# Patient Record
Sex: Male | Born: 1995
Health system: Southern US, Community
[De-identification: ages and names within clinical notes are randomized; demographics above are authoritative.]

---
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
## 2016-02-02 ENCOUNTER — Emergency Department (HOSPITAL_COMMUNITY)
Admission: EM | Admit: 2016-02-02 | Discharge: 2016-02-02 | Disposition: A | Discharge status: Home / Self Care | Payer: Managed Care, Other (non HMO) | Attending: Emergency Medicine | Admitting: Emergency Medicine

## 2016-02-02 ENCOUNTER — Emergency Department (HOSPITAL_COMMUNITY): Payer: Managed Care, Other (non HMO)

## 2016-02-02 ENCOUNTER — Encounter (HOSPITAL_COMMUNITY): Payer: Self-pay

## 2016-02-02 DIAGNOSIS — R51 Headache: Secondary | ICD-10-CM | POA: Insufficient documentation

## 2016-02-02 DIAGNOSIS — Y92410 Unspecified street and highway as the place of occurrence of the external cause: Secondary | ICD-10-CM | POA: Diagnosis not present

## 2016-02-02 DIAGNOSIS — S161XXA Strain of muscle, fascia and tendon at neck level, initial encounter: Secondary | ICD-10-CM | POA: Insufficient documentation

## 2016-02-02 DIAGNOSIS — Y939 Activity, unspecified: Secondary | ICD-10-CM | POA: Diagnosis not present

## 2016-02-02 DIAGNOSIS — F17200 Nicotine dependence, unspecified, uncomplicated: Secondary | ICD-10-CM | POA: Diagnosis not present

## 2016-02-02 DIAGNOSIS — S39012A Strain of muscle, fascia and tendon of lower back, initial encounter: Secondary | ICD-10-CM | POA: Diagnosis not present

## 2016-02-02 DIAGNOSIS — Y999 Unspecified external cause status: Secondary | ICD-10-CM | POA: Insufficient documentation

## 2016-02-02 DIAGNOSIS — S199XXA Unspecified injury of neck, initial encounter: Secondary | ICD-10-CM | POA: Diagnosis present

## 2016-02-02 MED ORDER — CYCLOBENZAPRINE HCL 10 MG PO TABS: 10.0000 mg | ORAL_TABLET | Freq: Two times a day (BID) | ORAL | 0 refills | Status: DC | PRN

## 2016-02-02 MED ORDER — IBUPROFEN 800 MG PO TABS: 800.0000 mg | ORAL_TABLET | Freq: Three times a day (TID) | ORAL | 0 refills | Status: DC
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
## 2016-02-02 NOTE — ED Notes (Signed)
Patient taken to xray.
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
## 2016-06-03 ENCOUNTER — Encounter: Payer: Self-pay | Admitting: Family Medicine

## 2016-06-03 ENCOUNTER — Ambulatory Visit (INDEPENDENT_AMBULATORY_CARE_PROVIDER_SITE_OTHER): Payer: Managed Care, Other (non HMO) | Admitting: Family Medicine

## 2016-06-03 VITALS — BP 92/60 | HR 73 | Temp 98.1°F | Ht 72.0 in | Wt 156.2 lb

## 2016-06-03 DIAGNOSIS — L659 Nonscarring hair loss, unspecified: Secondary | ICD-10-CM | POA: Insufficient documentation

## 2016-06-03 DIAGNOSIS — Z0000 Encounter for general adult medical examination without abnormal findings: Secondary | ICD-10-CM | POA: Diagnosis not present

## 2016-06-03 DIAGNOSIS — F17200 Nicotine dependence, unspecified, uncomplicated: Secondary | ICD-10-CM | POA: Diagnosis not present

## 2016-06-03 LAB — COMPLETE METABOLIC PANEL WITH GFR
ALT: 20 U/L (ref 9–46)
AST: 33 U/L (ref 10–40)
Albumin: 4.3 g/dL (ref 3.6–5.1)
Alkaline Phosphatase: 61 U/L (ref 40–115)
BUN: 13 mg/dL (ref 7–25)
CALCIUM: 9.2 mg/dL (ref 8.6–10.3)
CHLORIDE: 107 mmol/L (ref 98–110)
CO2: 26 mmol/L (ref 20–31)
CREATININE: 0.85 mg/dL (ref 0.60–1.35)
GFR, Est African American: >89 mL/min (ref >=60)
GFR, Est Non African American: >89 mL/min (ref >=60)
GLUCOSE: 91 mg/dL (ref 65–99)
POTASSIUM: 4 mmol/L (ref 3.5–5.3)
SODIUM: 139 mmol/L (ref 135–146)
Total Bilirubin: 0.6 mg/dL (ref 0.2–1.2)
Total Protein: 7.2 g/dL (ref 6.1–8.1)

## 2016-06-03 LAB — TSH: TSH: 0.6 mIU/L (ref 0.40–4.50)

## 2016-06-03 NOTE — Assessment & Plan Note (Addendum)
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  Clinical presentation is consistent.  There does not appear to be any evidence of fungal infection or trauma to these areas.  He has no anxiety/ OCD behaviors.  He does have a FM Hx of Graves disease but I would suspect more of a thinning process rather than frank, patchy hairloss.  TSH and BMP ordered for completion.  Referral to Dermatology placed.  Considered topical steroids but will await management/ recommendations from Derm.

## 2016-06-03 NOTE — Progress Notes (Signed)
Clinton Callahan is a 21 y.o. male presents to office today for annual physical exam examination.  Concerns today include:  1. Hair loss Patient reports that he noticed hair loss at the back of his head near the nape of his neck several weeks ago.  He denies itching, trauma.  He does not know what happened to the hair because it was not on his pillow.  No new pets, recent travel, rashes, hair loss elsewhere.  He notes that his father and paternal grandfather had Grave's Disease.  He denies palpitations, diarrhea, skin changes, excessive energy.  Last eye exam: >1 year, no h/o vision problems Last dental exam: 2 years ago  History reviewed. No pertinent past medical history. Social History   Social History  . Marital status: Single    Spouse name: N/A  . Number of children: N/A  . Years of education: N/A   Occupational History  . Not on file.   Social History Main Topics  . Smoking status: Current Every Day Smoker    Types: Cigars  . Smokeless tobacco: Never Used     Comment: black and mild  . Alcohol use No  . Drug use: No  . Sexual activity: Yes   Other Topics Concern  . Not on file   Social History Narrative   11th grade, lives with mom, grandma and sister   History reviewed. No pertinent surgical history. Family History  Problem Relation Age of Onset  . Heart murmur Mother   . Anxiety disorder Mother   . Graves' disease Father   . Diabetes Maternal Grandfather   . Heart disease Neg Hx   . Stroke Neg Hx     ROS: Review of Systems Constitutional: negative Eyes: negative Ears, nose, mouth, throat, and face: negative Respiratory: negative Cardiovascular: negative Gastrointestinal: negative Genitourinary:negative Integument/breast: positive for hair loss Hematologic/lymphatic: negative Musculoskeletal:positive for back pain Neurological: negative Behavioral/Psych: negative Endocrine: negative Allergic/Immunologic: negative    Physical exam BP 92/60    Pulse 73   Temp 98.1 F (36.7 C) (Oral)   Ht 6' (1.829 m)   Wt 156 lb 3.2 oz (70.9 kg)   SpO2 95%   BMI 21.18 kg/m  General appearance: alert, cooperative, appears stated age and no distress Head: Normocephalic, without obvious abnormality, atraumatic Eyes: negative findings: lids and lashes normal, conjunctivae and sclerae normal, corneas clear and pupils equal, round, reactive to light and accomodation Ears: normal TM's and external ear canals both ears Nose: Nares normal. Septum midline. Mucosa normal. No drainage or sinus tenderness. Throat: lips, mucosa, and tongue normal; teeth and gums normal Neck: no adenopathy, supple, symmetrical, trachea midline and thyroid with slight fullness but no palpable nodules/ abnormalities Back: symmetric, no curvature. ROM normal. No CVA tenderness. Lungs: clear to auscultation bilaterally Chest wall: no tenderness Heart: regular rate and rhythm, S1, S2 normal, no murmur, click, rub or gallop Abdomen: soft, non-tender; bowel sounds normal; no masses,  no organomegaly Extremities: extremities normal, atraumatic, no cyanosis or edema Pulses: 2+ and symmetric Skin: Skin color, texture, turgor normal. No rashes or lesions or 2.5 cm strip of hair missing from the base of the head. also a nickel size round area of missing hair along the base of the skull no the right. no skin breakdown/ irritation or abnormalities appreciated in these areas. Lymph nodes: Cervical, supraclavicular, and axillary nodes normal. Neurologic: Grossly normal    Assessment/ Plan: [name] here for annual physical exam.   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  Clinical presentation is consistent.  There does not appear to be any evidence of fungal infection or trauma to these areas.  He has no anxiety/ OCD behaviors.  He does have a FM Hx of Graves disease but I would suspect more of a thinning  process rather than frank, patchy hairloss.  Referral to Dermatology placed.  Considered topical steroids but will await management/ recommendations from Derm.  Tobacco use disorder Contemplative/ Action.  Recently had a child and has stopped smoking by default.  DOT form filled out and placed up front to be picked up.  [name], DO PGY-3, [facility]

## 2016-06-03 NOTE — Patient Instructions (Signed)
Alopecia Areata Alopecia areata is a type of hair loss. If you have this condition, you may lose hair on your scalp in patches. In some cases, you may lose all the hair on your scalp (alopecia totalis) or all the hair from your face and body (alopecia universalis).  Alopecia areata is an autoimmune disease. This means your body's defense system (immune system) mistakes normal parts of your body for germs or other things that can make you sick. When you have alopecia areata, your immune system attacks your hair follicles.  Alopecia areata often starts during childhood but can occur at any age. Alopecia areata is not a danger to your health but can be stressful.  CAUSES  The cause of alopecia areata is unknown.  RISK FACTORS You may be at higher risk of alopecia areata if you:   Have a family history of alopecia.  Have a family history of another autoimmune disease, including type 1 diabetes and rheumatoid arthritis. SIGNS AND SYMPTOMS Signs of alopecia areata may include:  Loss of scalp hair in small, round patches. These may be about the size of a quarter.  Loss of all hair on your scalp.  Loss of eyebrow hair, facial hair, or the hair inside your nose (nasal hair).  Hair loss over your entire body. DIAGNOSIS  Alopecia areata may be diagnosed by:  Medical history and physical exam.  Taking a sample of hair to check under a microscope.  Taking a small piece of skin (biopsy) to examine under a microscope.  Blood tests to rule out other autoimmune diseases. TREATMENT  There is no cure for alopecia areata, but the disease often goes away over time. You will not lose the ability to regrow hair. Some medicines may help your hair regrow more quickly. These include:  Corticosteroids. These block inflammation caused by your immune system. You may get this medicine as a lotion for your skin or as an injection.  Minoxidil. This is a hair growth medicine you can use in areas of hair  loss.  Anthralin. This is a medicine for a skin inflammation called psoriasis that may also help alopecia.  Diphencyprone. This medicine is applied to your skin and may stimulate hair growth. HOME CARE INSTRUCTIONS  Use sunscreen or cover your head when outdoors.  Take medicines only as directed by your health care provider.  If you have lost your eyebrows, wear sunglasses outside to keep dust out of your eyes.  If you have lost hair inside your nose, wear a kerchief over your face or apply ointment to the inside of your nose. This keeps out dust and other irritants.  Keep all follow-up visits as directed by your health care provider. This is important. SEEK MEDICAL CARE IF:  Your symptoms change.  You have new symptoms.  You have a reaction to your medicines.  You are struggling emotionally. This information is not intended to replace advice given to you by your health care provider. Make sure you discuss any questions you have with your health care provider. Document Released: 10/10/2003 Document Revised: 03/28/2014 Document Reviewed: 05/27/2013 Elsevier Interactive Patient Education  2017 [employer]  

## 2016-06-03 NOTE — Assessment & Plan Note (Signed)
Contemplative/ Action.  Recently had a child and has stopped smoking by default.

## 2016-06-08 ENCOUNTER — Telehealth: Payer: Self-pay

## 2016-06-08 ENCOUNTER — Encounter (HOSPITAL_COMMUNITY): Payer: Self-pay | Admitting: Emergency Medicine

## 2016-06-08 ENCOUNTER — Emergency Department (HOSPITAL_COMMUNITY): Payer: Managed Care, Other (non HMO)

## 2016-06-08 ENCOUNTER — Emergency Department (HOSPITAL_COMMUNITY)
Admission: EM | Admit: 2016-06-08 | Discharge: 2016-06-08 | Disposition: A | Discharge status: Home / Self Care | Payer: Managed Care, Other (non HMO) | Attending: Emergency Medicine | Admitting: Emergency Medicine

## 2016-06-08 DIAGNOSIS — F17210 Nicotine dependence, cigarettes, uncomplicated: Secondary | ICD-10-CM | POA: Insufficient documentation

## 2016-06-08 DIAGNOSIS — M25561 Pain in right knee: Secondary | ICD-10-CM | POA: Insufficient documentation

## 2016-06-08 DIAGNOSIS — Y999 Unspecified external cause status: Secondary | ICD-10-CM | POA: Insufficient documentation

## 2016-06-08 DIAGNOSIS — Y929 Unspecified place or not applicable: Secondary | ICD-10-CM | POA: Insufficient documentation

## 2016-06-08 DIAGNOSIS — Y9367 Activity, basketball: Secondary | ICD-10-CM | POA: Diagnosis not present

## 2016-06-08 DIAGNOSIS — W500XXA Accidental hit or strike by another person, initial encounter: Secondary | ICD-10-CM | POA: Diagnosis not present

## 2016-06-08 MED ORDER — IBUPROFEN 800 MG PO TABS: 800.0000 mg | ORAL_TABLET | Freq: Three times a day (TID) | ORAL | 0 refills | Status: DC | PRN

## 2016-06-08 NOTE — Discharge Instructions (Signed)
It was my pleasure taking care of you today!   Ibuprofen as needed for pain. Ice and elevate knee throughout the day.  Follow up with the orthopedist listed if symptoms do not improve in one week.   Return to the ER for new or worsening symptoms, any additional concerns.

## 2016-06-08 NOTE — Telephone Encounter (Signed)
-----   Message from Clinton IpAshly M Gottschalk, DO sent at 06/06/2016  2:01 PM EDT ----- No evidence of electrolyte imbalance or thyroid disorder.  Please call patient to inform and have him keep his dermatology appt.

## 2016-06-08 NOTE — ED Provider Notes (Signed)
[facility] Provider Note   CSN: [id number] Arrival date & time: 06/08/16  4098  By signing my name below, I, Cynda Acres, attest that this documentation has been prepared under the direction and in the presence of [facility], PA-C.Marland Kitchen Electronically Signed: Cynda Acres, Scribe. 06/08/16. 10:14 AM.  History   Chief Complaint Chief Complaint  Patient presents with  . Knee Injury   HPI Comments: Clinton Callahan is a 21 y.o. male with no pertinent medical history who presents to the Emergency Department complaining of sudden-onset, constant right knee pain that began yesterday. Patient states he was playing basketball yesterday, when his right knee hit another person's knee. Patient was unable to continue playing basketball. Patient reports associated pain with range of motion. Patient reports taking tylenol with no relief. Patient is ambulatory in the emergency department. Patient denies any numbness, weakness, nausea, vomiting, back pain, or neck pain.   The history is provided by the patient. No language interpreter was used.    History reviewed. No pertinent past medical history.  Patient Active Problem List   Diagnosis Date Noted  . Tobacco use disorder 06/03/2016  . Hair loss 06/03/2016  . Low back pain 11/16/2011    History reviewed. No pertinent surgical history.     Home Medications    Prior to Admission medications   Medication Sig Start Date End Date Taking? Authorizing Provider  ibuprofen (ADVIL,MOTRIN) 800 MG tablet Take 1 tablet (800 mg total) by mouth every 8 (eight) hours as needed. 06/08/16   Chase Picket Ward, PA-C    Family History Family History  Problem Relation Age of Onset  . Heart murmur Mother   . Anxiety disorder Mother   . Graves' disease Father   . Diabetes Maternal Grandfather   . Heart disease Neg Hx   . Stroke Neg Hx     Social History Social History  Substance Use Topics  . Smoking status: Current Every Day Smoker   Types: Cigars  . Smokeless tobacco: Never Used     Comment: black and mild  . Alcohol use No     Allergies   Patient has no known allergies.   Review of Systems Review of Systems  Gastrointestinal: Negative for nausea and vomiting.  Musculoskeletal: Positive for arthralgias (right knee). Negative for back pain, joint swelling and neck pain.  Neurological: Negative for weakness and numbness.     Physical Exam Updated Vital Signs BP 122/63 (BP Location: Right Arm)   Pulse (!) 52   Temp 98.4 F (36.9 C) (Oral)   Resp 16   Ht 6' (1.829 m)   Wt 68 kg   SpO2 99%   BMI 20.34 kg/m   Physical Exam  Constitutional: He appears well-developed and well-nourished. No distress.  HENT:  Head: Normocephalic and atraumatic.  Neck: Neck supple.  Cardiovascular: Normal rate, regular rhythm and normal heart sounds.   No murmur heard. Pulmonary/Chest: Effort normal and breath sounds normal. No respiratory distress. He has no wheezes. He has no rales.  Musculoskeletal: Normal range of motion. He exhibits tenderness. He exhibits no edema or deformity.  Right knee: No gross deformity noted. Knee with full ROM. + medial joint line tenderness. There is no joint effusion or swelling appreciated. No abnormal alignment or patellar mobility. No bruising, erythema, or warmth overlaying the joint. No varus/valgus laxity. Negative drawer's, negative Lachman's, no crepitus. 2+ DP pulses bilaterally. All compartments are soft. Sensation intact distal to injury.  Neurological: He is alert.  Skin: Skin  is warm and dry.  Psychiatric: He has a normal mood and affect.  Nursing note and vitals reviewed.    ED Treatments / Results  DIAGNOSTIC STUDIES: Oxygen Saturation is 99% on RA, normal by my interpretation.    COORDINATION OF CARE: 10:13 AM Discussed treatment plan with pt at bedside and pt agreed to plan, which includes ibuprofen.   Labs (all labs ordered are listed, but only abnormal results are  displayed) Labs Reviewed - No data to display  EKG  EKG Interpretation None       Radiology Dg Knee Complete 4 Views Right  Result Date: 06/08/2016 CLINICAL DATA:  Right knee pain after basketball injury yesterday. EXAM: RIGHT KNEE - COMPLETE 4+ VIEW COMPARISON:  None. FINDINGS: No evidence of fracture, dislocation, or joint effusion. No evidence of arthropathy or other focal bone abnormality. Soft tissues are unremarkable. IMPRESSION: Normal right knee. Electronically Signed   By: Lupita RaiderJames  Green Jr, M.D.   On: 06/08/2016 09:59    Procedures Procedures (including critical care time)  Medications Ordered in ED Medications - No data to display   Initial Impression / Assessment and Plan / ED Course  I have reviewed the triage vital signs and the nursing notes.  Pertinent labs & imaging results that were available during my care of the patient were reviewed by me and considered in my medical decision making (see chart for details).    Clinton Callahan is a 21 y.o. male who presents to ED for acute onset of right knee pain. On exam, ligaments appear intact and RLE is NVI. He does have tenderness to medial joint line but no crepitus appreciated and no pain with McMurray's. Patient has a knee sleeve at home which he would like to use. Offered crutches, but patient declines and is able to ambulate. Ortho follow up if symptoms persist. Return precautions discussed and all questions answered.    Final Clinical Impressions(s) / ED Diagnoses   Final diagnoses:  Acute pain of right knee    New Prescriptions Discharge Medication List as of 06/08/2016 10:16 AM     I personally performed the services described in this documentation, which was scribed in my presence. The recorded information has been reviewed and is accurate.    [facility] LLC[facility]ime Pilcher Ward, PA-C 06/08/16 1139    Benjiman CoreNathan Pickering, MD 06/08/16 [phone]

## 2016-06-08 NOTE — Telephone Encounter (Signed)
Pt informed. Sharon T Saunders, CMA  

## 2016-06-08 NOTE — ED Triage Notes (Signed)
Pt states he was playing bball yesterday and hit his right knee with another person's knee. Pt ambulatory. States he is in pain.

## 2016-08-14 ENCOUNTER — Emergency Department (HOSPITAL_COMMUNITY)
Admission: EM | Admit: 2016-08-14 | Discharge: 2016-08-14 | Disposition: A | Discharge status: Home / Self Care | Payer: Managed Care, Other (non HMO) | Attending: Emergency Medicine | Admitting: Emergency Medicine

## 2016-08-14 ENCOUNTER — Encounter (HOSPITAL_COMMUNITY): Payer: Self-pay

## 2016-08-14 DIAGNOSIS — F17290 Nicotine dependence, other tobacco product, uncomplicated: Secondary | ICD-10-CM | POA: Insufficient documentation

## 2016-08-14 DIAGNOSIS — L02412 Cutaneous abscess of left axilla: Secondary | ICD-10-CM | POA: Diagnosis not present

## 2016-08-14 DIAGNOSIS — R2232 Localized swelling, mass and lump, left upper limb: Secondary | ICD-10-CM | POA: Diagnosis present

## 2016-08-14 MED ORDER — LIDOCAINE HCL (PF) 1 % IJ SOLN
10.0000 mL | Freq: Once | INTRAMUSCULAR | Status: AC
  Administered 2016-08-14: 10 mL via INTRADERMAL
  Filled 2016-08-14: qty 10

## 2016-08-14 MED ORDER — CEPHALEXIN 500 MG PO CAPS: 500.0000 mg | ORAL_CAPSULE | Freq: Four times a day (QID) | ORAL | 0 refills | Status: DC

## 2016-08-14 MED ORDER — IBUPROFEN 400 MG PO TABS
600.0000 mg | ORAL_TABLET | Freq: Once | ORAL | Status: AC
  Administered 2016-08-14: 22:00:00 600 mg via ORAL
  Filled 2016-08-14: qty 1

## 2016-08-14 NOTE — ED Triage Notes (Signed)
Pt presents to the ed for a boil under his left arm that started 2 days ago

## 2016-08-14 NOTE — ED Provider Notes (Signed)
[facility] Provider Note   CSN: [id number] Arrival date & time: 08/14/16  2038  By signing my name below, I, Ny'Kea Lewis, attest that this documentation has been prepared under the direction and in the presence of Graciella FreerLindsey Emree Locicero, PA-C.  Electronically Signed: Karren CobbleNy'Kea Lewis, ED Scribe. 08/14/16. 10:27 PM.   History   Chief Complaint Chief Complaint  Patient presents with  . Recurrent Skin Infections   The history is provided by the patient. No language interpreter was used.   HPI Comments: Clinton Callahan is a 21 y.o. male with no pertinent PMHx, who presents to the Emergency Department complaining of a moderate, gradually worsening area of pain and swelling to the left armpit onset that started two days ago. Pt states pain is  exacerbated with direct pressure. Pt notes the presence of the area for a  but reports it has increased in size over the past two days. He has never experienced this issue before. He initally thought it was an ingrown hair so there were no treatment tried. Denies fever, chills, drainage from the area.   History reviewed. No pertinent past medical history.  Patient Active Problem List   Diagnosis Date Noted  . Tobacco use disorder 06/03/2016  . Hair loss 06/03/2016  . Low back pain 11/16/2011   History reviewed. No pertinent surgical history.  Home Medications    Prior to Admission medications   Medication Sig Start Date End Date Taking? Authorizing Provider  cephALEXin (KEFLEX) 500 MG capsule Take 1 capsule (500 mg total) by mouth 4 (four) times daily. 08/14/16   Maxwell CaulLayden, Shontez Sermon A, PA-C  ibuprofen (ADVIL,MOTRIN) 800 MG tablet Take 1 tablet (800 mg total) by mouth every 8 (eight) hours as needed. 06/08/16   Ward, Chase PicketJaime Pilcher, PA-C   Family History Family History  Problem Relation Age of Onset  . Heart murmur Mother   . Anxiety disorder Mother   . Graves' disease Father   . Diabetes Maternal Grandfather   . Heart disease Neg Hx   . Stroke Neg  Hx    Social History Social History  Substance Use Topics  . Smoking status: Current Every Day Smoker    Types: Cigars  . Smokeless tobacco: Never Used     Comment: black and mild  . Alcohol use No   Allergies   Patient has no known allergies.  Review of Systems Review of Systems  Constitutional: Negative for fever.  Gastrointestinal: Negative for vomiting.  Skin: Positive for wound (left axillia abcess ).  All other systems reviewed and are negative.  Physical Exam Updated Vital Signs BP 113/61   Pulse 61   Temp 98.2 F (36.8 C) (Oral)   Resp 18   SpO2 99%   Physical Exam  Constitutional: He appears well-developed and well-nourished.  Sitting comfortably on examination table  HENT:  Head: Normocephalic and atraumatic.  Eyes: Conjunctivae and EOM are normal. Right eye exhibits no discharge. Left eye exhibits no discharge. No scleral icterus.  Pulmonary/Chest: Effort normal.  Neurological: He is alert.  Skin: Skin is warm and dry. Capillary refill takes less than 2 seconds.  Left axilla with 2.5 cm area of erythema, swelling, and induration with central fluctuance. Purulent drainage is expressed with applied pressure.  Psychiatric: He has a normal mood and affect. His speech is normal and behavior is normal.  Nursing note and vitals reviewed.  ED Treatments / Results  DIAGNOSTIC STUDIES: Oxygen Saturation is 99% on RA, normal by my interpretation.   COORDINATION  OF CARE: 9:55 PM-Discussed next steps with pt. Pt verbalized understanding and is agreeable with the plan.   Labs (all labs ordered are listed, but only abnormal results are displayed) Labs Reviewed - No data to display  EKG  EKG Interpretation None       Radiology No results found.  Procedures .Marland KitchenIncision and Drainage Date/Time: 08/14/2016 11:15 PM Performed by: Graciella Freer A Authorized by: Graciella Freer A   Consent:    Consent obtained:  Verbal   Consent given by:  Patient    Risks discussed:  Incomplete drainage and infection Location:    Type:  Abscess   Location:  Upper extremity   Upper extremity location: Axilla. Pre-procedure details:    Skin preparation:  Betadine Anesthesia (see MAR for exact dosages):    Anesthesia method:  Local infiltration   Local anesthetic:  Lidocaine 1% w/o epi Procedure type:    Complexity:  Simple Procedure details:    Needle aspiration: yes     Needle size:  25 G   Incision types:  Stab incision   Incision depth:  Submucosal   Scalpel blade:  11   Wound management:  Probed and deloculated, irrigated with saline and extensive cleaning   Drainage:  Purulent   Drainage amount:  Scant   Wound treatment:  Wound left open Post-procedure details:    Patient tolerance of procedure:  Tolerated well, no immediate complications   (including critical care time)  Medications Ordered in ED Medications  lidocaine (PF) (XYLOCAINE) 1 % injection 10 mL (10 mLs Intradermal Given 08/14/16 2227)  ibuprofen (ADVIL,MOTRIN) tablet 600 mg (600 mg Oral Given 08/14/16 2227)    Initial Impression / Assessment and Plan / ED Course  I have reviewed the triage vital signs and the nursing notes.  Pertinent labs & imaging results that were available during my care of the patient were reviewed by me and considered in my medical decision making (see chart for details).     21 year old male who presents with 2 days of redness/swelling to left axilla. History/physical exam shows an area of erythema to the left axilla with some fluctuance to the central area. Some purulent drainage expressed with applied pressure. Consider abscess with questionable epidermoid cyst. Given active drainage will plan to I&D in the department.    I&D as documented above. Patient initially had some purulent drainage with applied pressure. On opening incision he had some scant purulent discharge.  Abscess was not large enough to warrant packing or drain,  wound recheck in 2  days. Encouraged home warm soaks and flushing.  Mild signs of cellulitis is surrounding skin. Will plan to cover for antibiotics given surrounding erythema.  Will d/c to home.  Strict return precautions discussed. Patient was understanding and agreement to plan.   Final Clinical Impressions(s) / ED Diagnoses   Final diagnoses:  Abscess of left axilla   New Prescriptions New Prescriptions   CEPHALEXIN (KEFLEX) 500 MG CAPSULE    Take 1 capsule (500 mg total) by mouth 4 (four) times daily.   I personally performed the services described in this documentation, which was scribed in my presence. The recorded information has been reviewed and is accurate.     Maxwell Caul, PA-C 08/15/16 0159    Alvira Monday, MD 08/17/16 [phone]

## 2016-08-14 NOTE — Discharge Instructions (Signed)
Take antibiotics as directed.  You can take Tylenol or Ibuprofen as directed for pain.  Apply warm compresses to the area to help continue to express drainage. Continue to press and massage the area to help with drainage.  Return the emergency Department in 2 days for wound recheck..  Return the emergency Department for any worsening pain, worsening swelling/redness, fever, redness that extends down her arm or any other worsening or concerning symptoms.  If you do not have a primary care doctor you see regularly, please you the list below. Please call them to arrange for follow-up.    No Primary Care Doctor Call [facility]  [phone] Other agencies that provide inexpensive medical care    [facility]  [phone]    [facility]  [phone]    [facility]  [phone]    [facility]  [phone]    [facility]  [phone]    [facility]  [phone]

## 2016-08-14 NOTE — ED Notes (Signed)
Patient transported to [name] 

## 2016-10-09 ENCOUNTER — Ambulatory Visit (HOSPITAL_COMMUNITY)
Admission: EM | Admit: 2016-10-09 | Discharge: 2016-10-09 | Disposition: A | Discharge status: Home / Self Care | Payer: Managed Care, Other (non HMO) | Attending: Physician Assistant | Admitting: Physician Assistant

## 2016-10-09 ENCOUNTER — Encounter (HOSPITAL_COMMUNITY): Payer: Self-pay | Admitting: Emergency Medicine

## 2016-10-09 DIAGNOSIS — H0289 Other specified disorders of eyelid: Secondary | ICD-10-CM | POA: Diagnosis not present

## 2016-10-09 MED ORDER — CEPHALEXIN 750 MG PO CAPS: 750.0000 mg | ORAL_CAPSULE | Freq: Two times a day (BID) | ORAL | 0 refills | Status: DC

## 2016-10-09 NOTE — ED Triage Notes (Signed)
Left upper eye lid swollen and red

## 2016-10-09 NOTE — Discharge Instructions (Signed)
Follow up as needed

## 2016-10-09 NOTE — ED Provider Notes (Signed)
10/09/2016 7:48 PM   DOB: [birth year] / MRN: [id number]  SUBJECTIVE:  Clinton SchleinCaleb J Loud is a 21 y.o. male presenting for left eye lid pain that started 2 days ago and is worsening.  Vision is normal.  Denies nausea, HA.  Feels that he is getting worse.   He has No Known Allergies.   He  has no past medical history on file.    He  reports that he has been smoking Cigars.  He has never used smokeless tobacco. He reports that he does not drink alcohol or use drugs. He  reports that he currently engages in sexual activity. The patient  has no past surgical history on file.  His family history includes Anxiety disorder in his mother; Diabetes in his maternal grandfather; Luiz BlareGraves' disease in his father; Heart murmur in his mother.  Review of Systems  Constitutional: Negative for fever.  Eyes: Negative for blurred vision, double vision and photophobia.  Gastrointestinal: Negative for abdominal pain and vomiting.  Neurological: Negative for dizziness.    OBJECTIVE:  BP (!) 111/57 (BP Location: Right Arm)   Pulse 60   Temp 98.1 F (36.7 C) (Oral)   Resp 16   SpO2 100%   Physical Exam  Constitutional: He appears well-developed. He is active and cooperative.  Non-toxic appearance.  Eyes: Left eye exhibits exudate. Left eye exhibits no chemosis, no discharge and no hordeolum. No foreign body present in the left eye. Left conjunctiva is not injected. Left conjunctiva has no hemorrhage. Left eye exhibits normal extraocular motion and no nystagmus. Left pupil is round and reactive.    Cardiovascular: Normal rate.   Pulmonary/Chest: Effort normal. No tachypnea.  Musculoskeletal: Normal range of motion.  Neurological: He is alert.  Skin: Skin is warm and dry. He is not diaphoretic. No pallor.  Vitals reviewed.   No results found for this or any previous visit (from the past 72 hour(s)).  No results found.  ASSESSMENT AND PLAN:  The encounter diagnosis was Eyelid pain, left.  Given his  tenderness on exam with cover for a preseptal cellulitis.  If that is this case then this is certainly very early.     The patient is advised to call or return to clinic if he does not see an improvement in symptoms, or to seek the care of the closest emergency department if he worsens with the above plan.   Deliah BostonMichael Romuald Mccaslin, MHS, PA-C 10/09/2016 7:48 PM    Ofilia Neaslark, Kailena Lubas L, PA-C 10/09/16 1949

## 2016-12-08 ENCOUNTER — Ambulatory Visit: Payer: Managed Care, Other (non HMO) | Admitting: Student

## 2017-01-03 ENCOUNTER — Encounter: Payer: Managed Care, Other (non HMO) | Admitting: Student

## 2017-02-16 ENCOUNTER — Telehealth: Payer: Self-pay | Admitting: *Deleted

## 2017-02-16 NOTE — Telephone Encounter (Signed)
Pt lm on nurse line asking about paperwork.  No evidence of paperwork and he has not been seen since 06/03/16, so would not have been dropped at that time.  Unable to reach by phone and no machine to leave message. Will await callback. Fleeger, Maryjo RochesterJessica Dawn, CMA

## 2017-02-27 ENCOUNTER — Ambulatory Visit: Payer: Managed Care, Other (non HMO) | Admitting: Student

## 2017-03-06 ENCOUNTER — Ambulatory Visit: Payer: Managed Care, Other (non HMO) | Admitting: Student

## 2017-03-08 ENCOUNTER — Other Ambulatory Visit: Payer: Self-pay

## 2017-03-08 ENCOUNTER — Ambulatory Visit (INDEPENDENT_AMBULATORY_CARE_PROVIDER_SITE_OTHER): Payer: Managed Care, Other (non HMO)

## 2017-03-08 ENCOUNTER — Encounter (HOSPITAL_COMMUNITY): Payer: Self-pay | Admitting: Emergency Medicine

## 2017-03-08 ENCOUNTER — Ambulatory Visit (HOSPITAL_COMMUNITY)
Admission: EM | Admit: 2017-03-08 | Discharge: 2017-03-08 | Disposition: A | Discharge status: Home / Self Care | Payer: Managed Care, Other (non HMO) | Attending: Family Medicine | Admitting: Family Medicine

## 2017-03-08 DIAGNOSIS — S90122A Contusion of left lesser toe(s) without damage to nail, initial encounter: Secondary | ICD-10-CM | POA: Diagnosis not present

## 2017-03-08 NOTE — Discharge Instructions (Signed)
Ice, elevation, ibuprofen as needed for pain control.

## 2017-03-08 NOTE — ED Triage Notes (Signed)
Pt states he had his left foot run over by a hand powered pallet fork lift yesterday when working at [employer].  Pt states he has pain in his three middle toes on his left foot.

## 2017-03-08 NOTE — ED Provider Notes (Signed)
[facility]    CSN: [id number] Arrival date & time: 03/08/17  1408     History   Chief Complaint Chief Complaint  Patient presents with  . Toe Injury    left 2nd, 3rd, & 4th    HPI Clinton SchleinCaleb J Callahan is a 21 y.o. male.   Clinton OliphantCaleb presents with complaints of pain to his second toe after it was ran over by a palette jack yesterday at work at [employer]. He had immediate pain. It is painful if it is hit, touches or if he walks wrong on it. Mild pain to third and fourth toes as well. Minimal pain while at rest, rates it 2/10 and throbbing currently. Has not taken any medications for his symptoms. Denies previous foot or toe injury. Denies numbness or tingling.    ROS per HPI.       History reviewed. No pertinent past medical history.  Patient Active Problem List   Diagnosis Date Noted  . Tobacco use disorder 06/03/2016  . Hair loss 06/03/2016  . Low back pain 11/16/2011    History reviewed. No pertinent surgical history.     Home Medications    Prior to Admission medications   Medication Sig Start Date End Date Taking? Authorizing Provider  ibuprofen (ADVIL,MOTRIN) 800 MG tablet Take 1 tablet (800 mg total) by mouth every 8 (eight) hours as needed. 06/08/16   Ward, Chase PicketJaime Pilcher, PA-C    Family History Family History  Problem Relation Age of Onset  . Heart murmur Mother   . Anxiety disorder Mother   . Graves' disease Father   . Diabetes Maternal Grandfather   . Heart disease Neg Hx   . Stroke Neg Hx     Social History Social History   Tobacco Use  . Smoking status: Never Smoker  . Smokeless tobacco: Never Used  . Tobacco comment: black and mild  Substance Use Topics  . Alcohol use: No    Alcohol/week: 0.0 oz  . Drug use: No     Allergies   Patient has no known allergies.   Review of Systems Review of Systems   Physical Exam Triage Vital Signs ED Triage Vitals  Enc Vitals Group     BP 03/08/17 1424 106/66     Pulse Rate 03/08/17  1424 65     Resp --      Temp 03/08/17 1424 98.1 F (36.7 C)     Temp Source 03/08/17 1424 Oral     SpO2 03/08/17 1424 96 %     Weight --      Height --      Head Circumference --      Peak Flow --      Pain Score 03/08/17 1423 3     Pain Loc --      Pain Edu? --      Excl. in GC? --    No data found.  Updated Vital Signs BP 106/66 (BP Location: Left Arm)   Pulse 65   Temp 98.1 F (36.7 C) (Oral)   SpO2 96%   Visual Acuity Right Eye Distance:   Left Eye Distance:   Bilateral Distance:    Right Eye Near:   Left Eye Near:    Bilateral Near:     Physical Exam  Constitutional: He is oriented to person, place, and time. He appears well-developed and well-nourished.  Cardiovascular: Normal rate and regular rhythm.  Pulmonary/Chest: Effort normal and breath sounds normal.  Musculoskeletal:  Left foot: There is tenderness, bony tenderness and swelling. There is normal range of motion, normal capillary refill, no crepitus, no deformity and no laceration.       Feet:  Mild swelling and tenderness to distal 2nd toe; full ROM to all toes; without tenderness to first, third, fourth or fifth toes; without break to skin  Neurological: He is alert and oriented to person, place, and time.  Skin: Skin is warm and dry.     UC Treatments / Results  Labs (all labs ordered are listed, but only abnormal results are displayed) Labs Reviewed - No data to display  EKG  EKG Interpretation None       Radiology Dg Foot Complete Left  Result Date: 03/08/2017 CLINICAL DATA:  Crush injury to the left foot with second through fourth toe pain. Initial encounter. EXAM: LEFT FOOT - COMPLETE 3+ VIEW COMPARISON:  None. FINDINGS: There is no evidence of fracture or dislocation. There is no evidence of arthropathy or other focal bone abnormality. Soft tissues are unremarkable. IMPRESSION: Negative. Electronically Signed   By: Marnee SpringJonathon  Watts M.D.   On: 03/08/2017 14:44     Procedures Procedures (including critical care time)  Medications Ordered in UC Medications - No data to display   Initial Impression / Assessment and Plan / UC Course  I have reviewed the triage vital signs and the nursing notes.  Pertinent labs & imaging results that were available during my care of the patient were reviewed by me and considered in my medical decision making (see chart for details).     Xray negative for acute injury. Pain consistent with contusion from being ran over. Ice, elevate, ibuprofen as needed. If symptoms worsen or do not improve in the next week to return to be seen or to follow up with PCP.  Patient verbalized understanding and agreeable to plan.  Ambulatory out of clinic without difficulty.    Final Clinical Impressions(s) / UC Diagnoses   Final diagnoses:  Contusion of lesser toe of left foot without damage to nail, initial encounter    ED Discharge Orders    None       Controlled Substance Prescriptions [place] Controlled Substance Registry consulted? Not Applicable   Georgetta HaberBurky, Jeury Mcnab B, NP 03/08/17 1454

## 2017-04-20 ENCOUNTER — Emergency Department (HOSPITAL_COMMUNITY): Payer: Managed Care, Other (non HMO)

## 2017-04-20 ENCOUNTER — Other Ambulatory Visit: Payer: Self-pay

## 2017-04-20 ENCOUNTER — Encounter (HOSPITAL_COMMUNITY): Payer: Self-pay | Admitting: Neurology

## 2017-04-20 ENCOUNTER — Emergency Department (HOSPITAL_COMMUNITY)
Admission: EM | Admit: 2017-04-20 | Discharge: 2017-04-20 | Disposition: A | Discharge status: Home / Self Care | Payer: Managed Care, Other (non HMO) | Attending: Emergency Medicine | Admitting: Emergency Medicine

## 2017-04-20 DIAGNOSIS — R509 Fever, unspecified: Secondary | ICD-10-CM | POA: Insufficient documentation

## 2017-04-20 DIAGNOSIS — F17210 Nicotine dependence, cigarettes, uncomplicated: Secondary | ICD-10-CM | POA: Diagnosis not present

## 2017-04-20 DIAGNOSIS — J101 Influenza due to other identified influenza virus with other respiratory manifestations: Secondary | ICD-10-CM

## 2017-04-20 DIAGNOSIS — M545 Low back pain, unspecified: Secondary | ICD-10-CM

## 2017-04-20 DIAGNOSIS — Z79899 Other long term (current) drug therapy: Secondary | ICD-10-CM | POA: Insufficient documentation

## 2017-04-20 LAB — CBC WITH DIFFERENTIAL/PLATELET
BASOS ABS: 0 10*3/uL (ref 0.0–0.1)
BASOS PCT: 0 %
Eosinophils Absolute: 0 10*3/uL (ref 0.0–0.7)
Eosinophils Relative: 0 %
HEMATOCRIT: 39 % (ref 39.0–52.0)
HEMOGLOBIN: 13.3 g/dL (ref 13.0–17.0)
Lymphocytes Relative: 18 %
Lymphs Abs: 0.9 10*3/uL (ref 0.7–4.0)
MCH: 28.1 pg (ref 26.0–34.0)
MCHC: 34.1 g/dL (ref 30.0–36.0)
MCV: 82.3 fL (ref 78.0–100.0)
Monocytes Absolute: 0.5 10*3/uL (ref 0.1–1.0)
Monocytes Relative: 10 %
NEUTROS ABS: 3.6 10*3/uL (ref 1.7–7.7)
NEUTROS PCT: 72 %
Platelets: 154 10*3/uL (ref 150–400)
RBC: 4.74 MIL/uL (ref 4.22–5.81)
RDW: 14.1 % (ref 11.5–15.5)
WBC: 5.1 10*3/uL (ref 4.0–10.5)

## 2017-04-20 LAB — COMPREHENSIVE METABOLIC PANEL
ALK PHOS: 60 U/L (ref 38–126)
ALT: 27 U/L (ref 17–63)
ANION GAP: 10 (ref 5–15)
AST: 29 U/L (ref 15–41)
Albumin: 4.2 g/dL (ref 3.5–5.0)
BILIRUBIN TOTAL: 0.4 mg/dL (ref 0.3–1.2)
BUN: 7 mg/dL (ref 6–20)
CALCIUM: 9.2 mg/dL (ref 8.9–10.3)
CO2: 23 mmol/L (ref 22–32)
Chloride: 103 mmol/L (ref 101–111)
Creatinine, Ser: 0.87 mg/dL (ref 0.61–1.24)
GFR calc non Af Amer: >60 mL/min (ref >60)
Glucose, Bld: 111 mg/dL — ABNORMAL HIGH (ref 65–99)
Potassium: 3 mmol/L — ABNORMAL LOW (ref 3.5–5.1)
SODIUM: 136 mmol/L (ref 135–145)
TOTAL PROTEIN: 7.6 g/dL (ref 6.5–8.1)

## 2017-04-20 LAB — INFLUENZA PANEL BY PCR (TYPE A & B)
INFLBPCR: NEGATIVE
Influenza A By PCR: POSITIVE — AB

## 2017-04-20 LAB — I-STAT CG4 LACTIC ACID, ED: Lactic Acid, Venous: 1.75 mmol/L (ref 0.5–1.9)

## 2017-04-20 MED ORDER — IBUPROFEN 800 MG PO TABS: 800.0000 mg | ORAL_TABLET | Freq: Three times a day (TID) | ORAL | 0 refills | Status: DC

## 2017-04-20 MED ORDER — KETOROLAC TROMETHAMINE 30 MG/ML IJ SOLN
30.0000 mg | Freq: Once | INTRAMUSCULAR | Status: AC
  Administered 2017-04-20: 30 mg via INTRAVENOUS
  Filled 2017-04-20: qty 1

## 2017-04-20 MED ORDER — SODIUM CHLORIDE 0.9 % IV BOLUS (SEPSIS)
1000.0000 mL | Freq: Once | INTRAVENOUS | Status: AC
  Administered 2017-04-20: 1000 mL via INTRAVENOUS

## 2017-04-20 MED ORDER — METHOCARBAMOL 500 MG PO TABS
750.0000 mg | ORAL_TABLET | Freq: Once | ORAL | Status: AC
Start: 2017-04-20 — End: 2017-04-20
  Administered 2017-04-20: 750 mg via ORAL
  Filled 2017-04-20: qty 2

## 2017-04-20 MED ORDER — OSELTAMIVIR PHOSPHATE 75 MG PO CAPS: 75.0000 mg | ORAL_CAPSULE | Freq: Two times a day (BID) | ORAL | 0 refills | Status: DC

## 2017-04-20 MED ORDER — ACETAMINOPHEN 325 MG PO TABS
650.0000 mg | ORAL_TABLET | Freq: Once | ORAL | Status: AC | PRN
  Administered 2017-04-20: 650 mg via ORAL
  Filled 2017-04-20: qty 2

## 2017-04-20 MED ORDER — CYCLOBENZAPRINE HCL 10 MG PO TABS: 10.0000 mg | ORAL_TABLET | Freq: Two times a day (BID) | ORAL | 0 refills | Status: DC | PRN

## 2017-04-20 MED ORDER — KETOROLAC TROMETHAMINE 30 MG/ML IJ SOLN: 30.0000 mg | Freq: Once | INTRAMUSCULAR | Status: DC

## 2017-04-20 NOTE — ED Provider Notes (Signed)
[facility] Provider Note   CSN: [id number] Arrival date & time: 04/20/17  1416     History   Chief Complaint Chief Complaint  Patient presents with  . Back Pain    HPI Clinton Callahan is a 22 y.o. male.  HPI Clinton Callahan is a 22 y.o. male with history of lower back pain, presents to emergency department with complaint of severe back pain.  Patient states that he has at baseline mild back discomfort from a prior weightlifting injury.  He states that he was feeling well yesterday, however this morning around 4 AM woke up in severe pain.  He reports pain is bilateral and midline over his lower back.  It does not radiate.  Pain is not reproducible with any palpation of the back but is worse with movement.  He states pain is more severe than it has ever been before.  He also reports waking up with a dry nonproductive cough.  He denies any nasal congestion.  No headache or neck pain or stiffness.  No chest pain or abdominal pain.  No urinary symptoms.  Denies any new injuries.  He took Tylenol this morning which helped.  Denies any nausea vomiting but states he has not had any appetite today.  Pt found to have fever of 102  History reviewed. No pertinent past medical history.  Patient Active Problem List   Diagnosis Date Noted  . Tobacco use disorder 06/03/2016  . Hair loss 06/03/2016  . Low back pain 11/16/2011    History reviewed. No pertinent surgical history.     Home Medications    Prior to Admission medications   Medication Sig Start Date End Date Taking? Authorizing Provider  ibuprofen (ADVIL,MOTRIN) 800 MG tablet Take 1 tablet (800 mg total) by mouth every 8 (eight) hours as needed. 06/08/16   Ward, Chase PicketJaime Pilcher, PA-C    Family History Family History  Problem Relation Age of Onset  . Heart murmur Mother   . Anxiety disorder Mother   . Graves' disease Father   . Diabetes Maternal Grandfather   . Heart disease Neg Hx   .  Stroke Neg Hx     Social History Social History   Tobacco Use  . Smoking status: Current Every Day Smoker  . Smokeless tobacco: Never Used  . Tobacco comment: black and mild  Substance Use Topics  . Alcohol use: No    Alcohol/week: 0.0 oz  . Drug use: No     Allergies   Patient has no known allergies.   Review of Systems Review of Systems  Constitutional: Negative for chills and fever.  Respiratory: Negative for cough, chest tightness and shortness of breath.   Cardiovascular: Negative for chest pain, palpitations and leg swelling.  Gastrointestinal: Negative for abdominal distention, abdominal pain, diarrhea, nausea and vomiting.  Genitourinary: Negative for dysuria, frequency, hematuria and urgency.  Musculoskeletal: Positive for arthralgias and back pain. Negative for myalgias, neck pain and neck stiffness.  Skin: Negative for rash.  Allergic/Immunologic: Negative for immunocompromised state.  Neurological: Negative for dizziness, weakness, light-headedness, numbness and headaches.  All other systems reviewed and are negative.    Physical Exam Updated Vital Signs BP (!) 130/58 (BP Location: Right Arm)   Pulse 89   Temp (!) 101.9 F (38.8 C) (Oral)   Resp 18   Ht 6' (1.829 m)   Wt 70.3 kg (155 lb)   SpO2 97%   BMI 21.02 kg/m   Physical Exam  Constitutional: He appears well-developed and well-nourished. No distress.  HENT:  Head: Normocephalic and atraumatic.  Right Ear: External ear normal.  Left Ear: External ear normal.  Nose: Nose normal.  Mouth/Throat: Oropharynx is clear and moist.  Neck: Normal range of motion. Neck supple.  No meningismus  Cardiovascular: Normal rate, regular rhythm and normal heart sounds.  Pulmonary/Chest: Effort normal and breath sounds normal. No respiratory distress. He has no wheezes. He has no rales.  Abdominal: Soft. He exhibits no distension and no mass. There is no tenderness. There is no guarding.  Musculoskeletal:    No tenderness to palpation over midline lumbar or thoracic spine or sacrum.  No tenderness over bilateral SI joints.  Patient does have pain in bilateral SI joint and sacrum with movement of bilateral hips.  No pain with straight leg raise bilaterally  Neurological:  5/5 and equal lower extremity strength. 2+ and equal patellar reflexes bilaterally. Pt able to dorsiflex bilateral toes and feet with good strength against resistance. Equal sensation bilaterally over thighs and lower legs.   Skin: Skin is warm and dry.  Nursing note and vitals reviewed.    ED Treatments / Results  Labs (all labs ordered are listed, but only abnormal results are displayed) Labs Reviewed  COMPREHENSIVE METABOLIC PANEL - Abnormal; Notable for the following components:      Result Value   Potassium 3.0 (*)    Glucose, Bld 111 (*)    All other components within normal limits  RESPIRATORY PANEL BY PCR  CBC WITH DIFFERENTIAL/PLATELET  URINALYSIS, ROUTINE W REFLEX MICROSCOPIC  I-STAT CG4 LACTIC ACID, ED  I-STAT CG4 LACTIC ACID, ED    EKG  EKG Interpretation None       Radiology Dg Chest 2 View  Result Date: 04/20/2017 CLINICAL DATA:  Chronic low back pain and intermittent cough. EXAM: CHEST  2 VIEW COMPARISON:  None. FINDINGS: The heart size and mediastinal contours are within normal limits. Both lungs are clear. The visualized skeletal structures are unremarkable. IMPRESSION: No active cardiopulmonary disease. Electronically Signed   By: Elberta Fortis M.D.   On: 04/20/2017 15:52    Procedures Procedures (including critical care time)  Medications Ordered in ED Medications  ketorolac (TORADOL) 30 MG/ML injection 30 mg (not administered)  methocarbamol (ROBAXIN) tablet 750 mg (not administered)  acetaminophen (TYLENOL) tablet 650 mg (650 mg Oral Given 04/20/17 1520)     Initial Impression / Assessment and Plan / ED Course  I have reviewed the triage vital signs and the nursing  notes.  Pertinent labs & imaging results that were available during my care of the patient were reviewed by me and considered in my medical decision making (see chart for details).  Clinical Course as of Apr 20 1841  Thu Apr 20, 2017  1802 WBC: 5.1 [MB]    Clinical Course User Index [MB] Anola Gurney    Patient in emergency department with sudden onset of worsening lower back pain early this morning, with no associated symptoms.  No lower extremity and weakness or numbness, no trouble controlling bowels or bladder, no concern for cauda equina.  He has no abdominal tenderness at all on my exam.  He is found to have a fever 102.  He does have mild cough.  Workup from triage showed normal white blood cell count, normal lactic acid, normal chest x-ray.  I added influenza panel.  If influenza panel negative, will get MRI of the lumbar spine and sacrum/pelvis to rule out infectious  source of his back pain.  Considered retrocecal appendicitis, however doubt with such sudden onset of sharp pain and no abdominal tenderness at all.  Patient received Tylenol upfront, temperature down to 101.9, will give Toradol, IV fluids, Robaxin for his back pain.  Patient signed out at shift change pending influenza panel.  If negative he will need to proceed with MRIs.  If positive, I suspect the patient most likely has worsening back pain from his influenza and okay to be discharged home.  I discussed this plan with patient and he agrees.  Vitals:   04/20/17 1511 04/20/17 1823 04/20/17 1825  BP: (!) 130/58    Pulse: (!) 105 89   Resp: 18  18  Temp: (!) 102 F (38.9 C)  (!) 101.9 F (38.8 C)  TempSrc: Oral  Oral  SpO2: 100% 97%   Weight: 70.3 kg (155 lb)    Height: 6' (1.829 m)       Final Clinical Impressions(s) / ED Diagnoses   Final diagnoses:  Acute midline low back pain without sciatica  Pyrexia of unknown origin following delivery    ED Discharge Orders    None        Jaynie Crumble, Cordelia Poche 04/20/17 2033    Phillis Haggis, MD 04/20/17 2055

## 2017-04-20 NOTE — ED Triage Notes (Addendum)
Pt reports chronic lower back pain, unknown cause. This morning at 0400 woke him up, and has been severe. Pain radiates around to right lower abdominal. Denies n/v/d. Denies hematuria. Denies any urinary sx. Fever 102.0. He does report cough intermittently. Denies any falls or injuries. Reports the back pain started years ago in weight training in high school.

## 2017-04-20 NOTE — ED Provider Notes (Signed)
Patient signed out to me at shift change.  Patient has had fever and low back pain.  Influenza panel is pending.  If influenza panel is negative, consider MRI and urinalysis for other etiologies.  Patient is influenza A positive.  Will treat with Tamiflu.  Will cancel MRIs per plan.  Discussed with Kirichenko and Dr. Phineas RealMabe, who agree with treatment and discharge plan.   Roxy HorsemanBrowning, Alp Goldwater, PA-C 04/20/17 2111    Phillis HaggisMabe, Martha L, MD 04/20/17 2118

## 2017-04-24 ENCOUNTER — Encounter: Payer: Self-pay | Admitting: Family Medicine

## 2017-04-24 ENCOUNTER — Other Ambulatory Visit: Payer: Self-pay

## 2017-04-24 ENCOUNTER — Ambulatory Visit (INDEPENDENT_AMBULATORY_CARE_PROVIDER_SITE_OTHER): Payer: Managed Care, Other (non HMO) | Admitting: Family Medicine

## 2017-04-24 VITALS — BP 100/70 | HR 77 | Temp 98.7°F | Wt 152.0 lb

## 2017-04-24 DIAGNOSIS — G8929 Other chronic pain: Secondary | ICD-10-CM

## 2017-04-24 DIAGNOSIS — M545 Low back pain: Secondary | ICD-10-CM

## 2017-04-24 DIAGNOSIS — F17200 Nicotine dependence, unspecified, uncomplicated: Secondary | ICD-10-CM

## 2017-04-24 DIAGNOSIS — L659 Nonscarring hair loss, unspecified: Secondary | ICD-10-CM | POA: Diagnosis not present
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
## 2017-04-24 NOTE — Assessment & Plan Note (Signed)
Still smoking 2-3 black/milds per day.   Declines cessation assistance

## 2017-04-24 NOTE — Assessment & Plan Note (Signed)
Renewing referal to derm, no new complaint but now has insurance

## 2017-04-24 NOTE — Assessment & Plan Note (Signed)
No bony abnormalities shown on multiple imaging in past years, still has chronic intermittent spasms.   Will refer to physical therapy and advise regular stretching/exercise with alternating ice/heat

## 2017-04-24 NOTE — Patient Instructions (Signed)
It was a pleasure to see you today! Thank you for choosing [facility] for your primary care. Clinton SchleinCaleb J Callahan was seen for low back pain. Come back to the clinic if you have any worsening symptoms, and go to the emergency room if you develop any numbness in your groin, muscle weakness or lose sensation.  We are sending you to physical therapy and asking that you make stretching/exercise part of your daily routine along with alternating cold/heat.    We can also resend your referal to derm for he balding spots of hair you have.  If we did any lab work today, and the results require attention, either me or my nurse will get in touch with you. If everything is normal, you will get a letter in mail and a message via . If you don't hear from us in two weeks, please give us a call. Otherwise, we look forward to seeing you again at your next visit. If you have any questions or concerns before then, please call the clinic at [phone].  Please bring all your medications to every doctors visit  Sign up for My Chart to have easy access to your labs results, and communication with your Primary care physician.    Please check-out at the front desk before leaving the clinic.    Best,  Dr. Marthenia RollingScott Keundra Petrucelli FAMILY MEDICINE RESIDENT - PGY1 04/24/2017 11:47 AM

## 2017-04-27 ENCOUNTER — Ambulatory Visit: Payer: Managed Care, Other (non HMO) | Attending: Family Medicine

## 2017-05-23 ENCOUNTER — Ambulatory Visit: Payer: Managed Care, Other (non HMO) | Attending: Family Medicine

## 2017-06-12 ENCOUNTER — Encounter (HOSPITAL_COMMUNITY): Payer: Self-pay | Admitting: Emergency Medicine

## 2017-06-12 ENCOUNTER — Ambulatory Visit (HOSPITAL_COMMUNITY)
Admission: EM | Admit: 2017-06-12 | Discharge: 2017-06-12 | Disposition: A | Discharge status: Home / Self Care | Payer: Managed Care, Other (non HMO) | Attending: Family Medicine | Admitting: Family Medicine

## 2017-06-12 ENCOUNTER — Ambulatory Visit (INDEPENDENT_AMBULATORY_CARE_PROVIDER_SITE_OTHER): Payer: Managed Care, Other (non HMO)

## 2017-06-12 DIAGNOSIS — Y9367 Activity, basketball: Secondary | ICD-10-CM | POA: Diagnosis not present

## 2017-06-12 DIAGNOSIS — S93401A Sprain of unspecified ligament of right ankle, initial encounter: Secondary | ICD-10-CM

## 2017-06-12 MED ORDER — DICLOFENAC SODIUM 75 MG PO TBEC: 75.0000 mg | DELAYED_RELEASE_TABLET | Freq: Two times a day (BID) | ORAL | 0 refills | Status: DC

## 2017-06-12 NOTE — ED Provider Notes (Signed)
[facility]   [id number] 06/12/17 Arrival Time: 1027  ASSESSMENT & PLAN:  1. Sprain of right ankle, unspecified ligament, initial encounter    Imaging: Dg Ankle Complete Right  Result Date: 06/12/2017 CLINICAL DATA:  Injured ankle playing basketball yesterday. EXAM: RIGHT ANKLE - COMPLETE 3+ VIEW COMPARISON:  Radiographs 02/26/2017 FINDINGS: The ankle mortise is maintained. No acute ankle fracture or osteochondral lesion. No definite joint effusion. The mid and hindfoot bony structures are intact. IMPRESSION: No acute fracture. Electronically Signed   By: Rudie MeyerP.  Gallerani M.D.   On: 06/12/2017 12:18   Dg Foot Complete Right  Result Date: 06/12/2017 CLINICAL DATA:  Basketball injury yesterday with lateral foot pain, initial encounter. EXAM: RIGHT FOOT COMPLETE - 3+ VIEW COMPARISON:  None. FINDINGS: No acute osseous or joint abnormality. IMPRESSION: No acute osseous or joint abnormality. Electronically Signed   By: Leanna BattlesMelinda  Blietz M.D.   On: 06/12/2017 11:46    Meds ordered this encounter  Medications  . diclofenac (VOLTAREN) 75 MG EC tablet    Sig: Take 1 tablet (75 mg total) by mouth 2 (two) times daily.    Dispense:  14 tablet    Refill:  0   ASO splint applied. Written information on treating ankle sprains given. Work note given. Will follow up with PCP or here if worsening or failing to improve as anticipated.  Reviewed expectations re: course of current medical issues. Questions answered. Outlined signs and symptoms indicating need for more acute intervention. Patient verbalized understanding. After Visit Summary given.  SUBJECTIVE: History from: patient. Clinton Callahan is a 22 y.o. male who reports fairly persistent and poorly localized pain of his right ankle that is stable; described as aching and dull without radiation Onset: gradual, 1-2 days ago. Injury/trama: yes, reports twisting ankle while playing basketball; able to bear weight with discomfort initially  and since. Relieved by: elevating ankle and putting ice on it. Worsened by: certain movements and weight bearing. Associated symptoms: none reported. Extremity sensation changes or weakness: none. Self treatment: has not tried OTCs for relief of pain.  ROS: As per HPI.   OBJECTIVE:  Vitals:   06/12/17 1134  BP: (!) 106/49  Pulse: (!) 57  Resp: 18  Temp: 98.2 F (36.8 C)  TempSrc: Oral  SpO2: 100%    General appearance: alert; no distress Extremities: no cyanosis or edema; symmetrical with no gross deformities; poorly localized tenderness over medial malleolus that extends toward foot; minimal swelling over medial malleolus; no specific 5th metatarsal tenderness; no bruising; FROM with medial discomfort CV: normal extremity capillary refill Skin: warm and dry Neurologic: normal gait; normal symmetric reflexes in all extremities; normal sensation in all extremities Psychological: alert and cooperative; normal mood and affect  No Known Allergies   Social History   Socioeconomic History  . Marital status: Single    Spouse name: Not on file  . Number of children: Not on file  . Years of education: Not on file  . Highest education level: Not on file  Occupational History  . Not on file  Social Needs  . Financial resource strain: Not on file  . Food insecurity:    Worry: Not on file    Inability: Not on file  . Transportation needs:    Medical: Not on file    Non-medical: Not on file  Tobacco Use  . Smoking status: Current Every Day Smoker  . Smokeless tobacco: Never Used  . Tobacco comment: black and mild  Substance and Sexual  Activity  . Alcohol use: No    Alcohol/week: 0.0 oz  . Drug use: No  . Sexual activity: Yes  Lifestyle  . Physical activity:    Days per week: Not on file    Minutes per session: Not on file  . Stress: Not on file  Relationships  . Social connections:    Talks on phone: Not on file    Gets together: Not on file    Attends religious  service: Not on file    Active member of club or organization: Not on file    Attends meetings of clubs or organizations: Not on file    Relationship status: Not on file  . Intimate partner violence:    Fear of current or ex partner: Not on file    Emotionally abused: Not on file    Physically abused: Not on file    Forced sexual activity: Not on file  Other Topics Concern  . Not on file  Social History Narrative   11th grade, lives with mom, grandma and sister   Family History  Problem Relation Age of Onset  . Heart murmur Mother   . Anxiety disorder Mother   . Graves' disease Father   . Diabetes Maternal Grandfather   . Heart disease Neg Hx   . Stroke Neg Hx    History reviewed. No pertinent surgical history.    Mardella Layman, MD 06/12/17 1258

## 2017-06-12 NOTE — ED Triage Notes (Signed)
Pt sts right foot pain x 2 days after twisting while playing basketball

## 2017-10-03 ENCOUNTER — Encounter: Payer: Managed Care, Other (non HMO) | Admitting: Student in an Organized Health Care Education/Training Program

## 2017-10-03 NOTE — Progress Notes (Deleted)
   Subjective:    [name] J [place] - 22 y.o. male MRN [id number]  Date of birth: [birth year]  HPI  [name] J Veldman is here for ***.  -  reports that he has been smoking.  He has never used smokeless tobacco. - Review of Systems: Per HPI. - Past Medical History: Patient Active Problem List   Diagnosis Date Noted  . Tobacco use disorder 06/03/2016  . Hair loss 06/03/2016  . Low back pain 11/16/2011   - Medications: reviewed and updated Current Outpatient Medications  Medication Sig Dispense Refill  . cyclobenzaprine (FLEXERIL) 10 MG tablet Take 1 tablet (10 mg total) by mouth 2 (two) times daily as needed for muscle spasms. 20 tablet 0  . diclofenac (VOLTAREN) 75 MG EC tablet Take 1 tablet (75 mg total) by mouth 2 (two) times daily. 14 tablet 0  . oseltamivir (TAMIFLU) 75 MG capsule Take 1 capsule (75 mg total) by mouth every 12 (twelve) hours. 10 capsule 0   No current facility-administered medications for this visit.     Review of Systems See HPI     Objective:   Physical Exam There were no vitals taken for this visit. Gen: NAD, alert, cooperative with exam, well-appearing *** HEENT: NCAT, PERRL, clear conjunctiva, oropharynx clear, supple neck CV: RRR, good S1/S2, no murmur, no edema, capillary refill brisk  Resp: CTABL, no wheezes, non-labored Abd: SNTND, BS present, no guarding or organomegaly Skin: no rashes, normal turgor  Neuro: no gross deficits.  Psych: good insight, alert and oriented     Assessment & Plan:   No problem-specific Assessment & Plan notes found for this encounter.   No orders of the defined types were placed in this encounter.   No orders of the defined types were placed in this encounter.   Howard PouchLauren Zyliah Schier, MD,MS,  PGY2 10/03/2017 4:49 AM

## 2017-10-14 ENCOUNTER — Encounter (HOSPITAL_COMMUNITY): Payer: Self-pay | Admitting: Emergency Medicine

## 2017-10-14 ENCOUNTER — Emergency Department (HOSPITAL_COMMUNITY)
Admission: EM | Admit: 2017-10-14 | Discharge: 2017-10-14 | Disposition: A | Discharge status: Home / Self Care | Payer: Managed Care, Other (non HMO) | Attending: Emergency Medicine | Admitting: Emergency Medicine

## 2017-10-14 DIAGNOSIS — L02211 Cutaneous abscess of abdominal wall: Secondary | ICD-10-CM | POA: Diagnosis present

## 2017-10-14 DIAGNOSIS — F17200 Nicotine dependence, unspecified, uncomplicated: Secondary | ICD-10-CM | POA: Diagnosis not present

## 2017-10-14 DIAGNOSIS — L02219 Cutaneous abscess of trunk, unspecified: Secondary | ICD-10-CM

## 2017-10-14 MED ORDER — SULFAMETHOXAZOLE-TRIMETHOPRIM 800-160 MG PO TABS: 1.0000 | ORAL_TABLET | Freq: Two times a day (BID) | ORAL | 0 refills | Status: AC

## 2017-10-14 MED ORDER — LIDOCAINE-EPINEPHRINE 1 %-1:100000 IJ SOLN
10.0000 mL | Freq: Once | INTRAMUSCULAR | Status: AC
  Administered 2017-10-14: 10 mL
  Filled 2017-10-14: qty 20

## 2017-10-14 MED ORDER — NAPROXEN 250 MG PO TABS
500.0000 mg | ORAL_TABLET | Freq: Once | ORAL | Status: AC
  Administered 2017-10-14: 500 mg via ORAL
  Filled 2017-10-14: qty 2

## 2017-10-14 MED ORDER — NAPROXEN 500 MG PO TABS: 500.0000 mg | ORAL_TABLET | Freq: Two times a day (BID) | ORAL | 0 refills | Status: DC

## 2017-10-14 NOTE — Discharge Instructions (Signed)
Take bactrim as prescribed until finished. Apply a warm compress to the area 3-4 times per day to promote drainage.

## 2017-10-14 NOTE — ED Triage Notes (Signed)
Pt reports abscess to lower abd area. Pt states he thinks he has an ingrown hair.

## 2017-10-14 NOTE — ED Provider Notes (Signed)
[facility] EMERGENCY DEPARTMENT Provider Note   CSN: [id number] Arrival date & time: 10/14/17  2130     History   Chief Complaint Chief Complaint  Patient presents with  . Abscess    HPI Clinton Callahan is a 22 y.o. male.  The history is provided by the patient. No language interpreter was used.  Abscess  Location:  Torso Torso abscess location: suprapubic abdominal wall. Size:  1x2.5cm Abscess quality: fluctuance and painful   Red streaking: no   Duration:  2 days Progression:  Worsening Pain details:    Quality:  Aching   Severity:  Moderate   Duration:  2 days   Timing:  Constant   Progression:  Worsening Chronicity:  New Context: not immunosuppression and not skin injury   Relieved by: topical tea tree oil. Associated symptoms: no fever   Risk factors: prior abscess     History reviewed. No pertinent past medical history.  Patient Active Problem List   Diagnosis Date Noted  . Tobacco use disorder 06/03/2016  . Hair loss 06/03/2016  . Low back pain 11/16/2011    History reviewed. No pertinent surgical history.      Home Medications    Prior to Admission medications   Medication Sig Start Date End Date Taking? Authorizing Provider  cyclobenzaprine (FLEXERIL) 10 MG tablet Take 1 tablet (10 mg total) by mouth 2 (two) times daily as needed for muscle spasms. 04/20/17   Roxy Horseman, PA-C  diclofenac (VOLTAREN) 75 MG EC tablet Take 1 tablet (75 mg total) by mouth 2 (two) times daily. 06/12/17   Mardella Layman, MD  naproxen (NAPROSYN) 500 MG tablet Take 1 tablet (500 mg total) by mouth 2 (two) times daily. 10/14/17   Antony Madura, PA-C  sulfamethoxazole-trimethoprim (BACTRIM DS,SEPTRA DS) 800-160 MG tablet Take 1 tablet by mouth 2 (two) times daily for 7 days. 10/14/17 10/21/17  Antony Madura, PA-C    Family History Family History  Problem Relation Age of Onset  . Heart murmur Mother   . Anxiety disorder Mother   . Graves' disease  Father   . Diabetes Maternal Grandfather   . Heart disease Neg Hx   . Stroke Neg Hx     Social History Social History   Tobacco Use  . Smoking status: Current Every Day Smoker  . Smokeless tobacco: Never Used  . Tobacco comment: black and mild  Substance Use Topics  . Alcohol use: No    Alcohol/week: 0.0 oz  . Drug use: No     Allergies   Patient has no known allergies.   Review of Systems Review of Systems  Constitutional: Negative for fever.  Ten systems reviewed and are negative for acute change, except as noted in the HPI.    Physical Exam Updated Vital Signs BP (!) 116/51   Pulse 71   Temp 97.7 F (36.5 C) (Oral)   Resp 16   Ht 5\' 8"  (1.727 m)   Wt 70.3 kg (155 lb)   SpO2 100%   BMI 23.57 kg/m   Physical Exam  Constitutional: He is oriented to person, place, and time. He appears well-developed and well-nourished. No distress.  Nontoxic appearing and in NAD  HENT:  Head: Normocephalic and atraumatic.  Eyes: Conjunctivae and EOM are normal. No scleral icterus.  Neck: Normal range of motion.  Cardiovascular: Normal rate, regular rhythm and intact distal pulses.  Pulmonary/Chest: Effort normal. No stridor. No respiratory distress.  Respirations even and unlabored  Genitourinary:  Musculoskeletal: Normal range of motion.  Neurological: He is alert and oriented to person, place, and time. He exhibits normal muscle tone. Coordination normal.  Ambulatory with steady gait.  Skin: Skin is warm and dry. No rash noted. He is not diaphoretic. No erythema. No pallor.  Psychiatric: He has a normal mood and affect. His behavior is normal.  Nursing note and vitals reviewed.    ED Treatments / Results  Labs (all labs ordered are listed, but only abnormal results are displayed) Labs Reviewed - No data to display  EKG None  Radiology No results found.  Procedures Procedures (including critical care time)  INCISION AND DRAINAGE Performed by: Antony MaduraKelly  Rorey Hodges Consent: Verbal consent obtained. Risks and benefits: risks, benefits and alternatives were discussed Type: abscess  Body area: suprapubic  Anesthesia: local infiltration  Incision was made with a scalpel.  Local anesthetic: lidocaine 1% with epinephrine  Anesthetic total: 3 ml  Complexity: complex Blunt dissection to break up loculations  Drainage: purulent  Drainage amount: small  Packing material: none  Patient tolerance: Patient tolerated the procedure well with no immediate complications.    Medications Ordered in ED Medications  lidocaine-EPINEPHrine (XYLOCAINE W/EPI) 1 %-1:100000 (with pres) injection 10 mL (has no administration in time range)  naproxen (NAPROSYN) tablet 500 mg (500 mg Oral Given 10/14/17 0519)     Initial Impression / Assessment and Plan / ED Course  I have reviewed the triage vital signs and the nursing notes.  Pertinent labs & imaging results that were available during my care of the patient were reviewed by me and considered in my medical decision making (see chart for details).     Patient with skin abscess amenable to incision and drainage.  Abscess was not large enough to warrant packing or drain,  wound recheck in 2 days. Encouraged home warm soaks and flushing.  Mild signs of cellulitis is surrounding skin.  Will d/c to home on Bactrim.  Return precautions discussed and provided. Patient discharged in stable condition with no unaddressed concerns.   Final Clinical Impressions(s) / ED Diagnoses   Final diagnoses:  Abscess of suprapubic region    ED Discharge Orders        Ordered    sulfamethoxazole-trimethoprim (BACTRIM DS,SEPTRA DS) 800-160 MG tablet  2 times daily     10/14/17 0606    naproxen (NAPROSYN) 500 MG tablet  2 times daily     10/14/17 0606       Antony MaduraHumes, Gerard Cantara, PA-C 10/14/17 [id number]    Dione BoozeGlick, David, MD 10/14/17 [phone]

## 2018-02-02 ENCOUNTER — Ambulatory Visit (HOSPITAL_COMMUNITY)
Admission: EM | Admit: 2018-02-02 | Discharge: 2018-02-02 | Disposition: A | Discharge status: Home / Self Care | Payer: Managed Care, Other (non HMO) | Attending: Family Medicine | Admitting: Family Medicine

## 2018-02-02 ENCOUNTER — Ambulatory Visit (INDEPENDENT_AMBULATORY_CARE_PROVIDER_SITE_OTHER): Payer: Managed Care, Other (non HMO)

## 2018-02-02 ENCOUNTER — Encounter (HOSPITAL_COMMUNITY): Payer: Self-pay

## 2018-02-02 DIAGNOSIS — S60221A Contusion of right hand, initial encounter: Secondary | ICD-10-CM

## 2018-02-02 DIAGNOSIS — W19XXXA Unspecified fall, initial encounter: Secondary | ICD-10-CM | POA: Diagnosis not present

## 2018-02-02 DIAGNOSIS — R2231 Localized swelling, mass and lump, right upper limb: Secondary | ICD-10-CM | POA: Diagnosis not present

## 2018-02-02 DIAGNOSIS — Y9367 Activity, basketball: Secondary | ICD-10-CM | POA: Diagnosis not present

## 2018-02-02 MED ORDER — IBUPROFEN 800 MG PO TABS: 800.0000 mg | ORAL_TABLET | Freq: Three times a day (TID) | ORAL | 0 refills | Status: DC

## 2018-02-02 NOTE — ED Provider Notes (Signed)
[facility]    CSN: [id number] Arrival date & time: 02/02/18  4098     History   Chief Complaint Chief Complaint  Patient presents with  . Hand Pain    HPI Clinton Callahan is a 22 y.o. male.   HPI  Patient states he was playing basketball this morning.  He jumped up for a shot, and another player "cut my legs out from under me".  He fell forward and landed on his right palm.  He is having right hand pain.  His hand is swollen.  He has pain with finger movement.  Pain with wrist movement.  Normal sensation.  No prior fracture.    History reviewed. No pertinent past medical history.  Patient Active Problem List   Diagnosis Date Noted  . Tobacco use disorder 06/03/2016  . Hair loss 06/03/2016  . Low back pain 11/16/2011    History reviewed. No pertinent surgical history.     Home Medications    Prior to Admission medications   Medication Sig Start Date End Date Taking? Authorizing Provider  ibuprofen (ADVIL,MOTRIN) 800 MG tablet Take 1 tablet (800 mg total) by mouth 3 (three) times daily. 02/02/18   Eustace Moore, MD    Family History Family History  Problem Relation Age of Onset  . Heart murmur Mother   . Anxiety disorder Mother   . Graves' disease Father   . Diabetes Maternal Grandfather   . Heart disease Neg Hx   . Stroke Neg Hx     Social History Social History   Tobacco Use  . Smoking status: Current Every Day Smoker  . Smokeless tobacco: Never Used  . Tobacco comment: black and mild  Substance Use Topics  . Alcohol use: No    Alcohol/week: 0.0 standard drinks  . Drug use: No     Allergies   Patient has no known allergies.   Review of Systems Review of Systems  Constitutional: Negative for chills and fever.  HENT: Negative for ear pain and sore throat.   Eyes: Negative for pain and visual disturbance.  Respiratory: Negative for cough and shortness of breath.   Cardiovascular: Negative for chest pain and palpitations.    Gastrointestinal: Negative for abdominal pain and vomiting.  Genitourinary: Negative for dysuria and hematuria.  Musculoskeletal: Negative for arthralgias and back pain.       Right hand pain  Skin: Negative for color change and rash.  Neurological: Negative for seizures and syncope.  All other systems reviewed and are negative.    Physical Exam Triage Vital Signs ED Triage Vitals  Enc Vitals Group     BP 02/02/18 0907 121/66     Pulse Rate 02/02/18 0907 (!) 58     Resp 02/02/18 0907 16     Temp 02/02/18 0907 98.4 F (36.9 C)     Temp Source 02/02/18 0907 Oral     SpO2 02/02/18 0907 100 %     Weight --      Height --      Head Circumference --      Peak Flow --      Pain Score 02/02/18 0915 8     Pain Loc --      Pain Edu? --      Excl. in GC? --    No data found.  Updated Vital Signs BP 121/66 (BP Location: Right Arm)   Pulse (!) 58   Temp 98.4 F (36.9 C) (Oral)   Resp 16  SpO2 100%      Physical Exam  Constitutional: He appears well-developed and well-nourished. No distress.  HENT:  Head: Normocephalic and atraumatic.  Mouth/Throat: Oropharynx is clear and moist.  Eyes: Pupils are equal, round, and reactive to light. Conjunctivae are normal.  Neck: Normal range of motion.  Cardiovascular: Normal rate.  Pulmonary/Chest: Effort normal. No respiratory distress.  Abdominal: Soft. He exhibits no distension.  Musculoskeletal: Normal range of motion. He exhibits no edema.  Slight swelling in the right thenar eminence.  No bruising noted.  Tenderness to palpation over the hamate.  Mild tenderness over the scaphoid.  Slow but full range of motion of the wrist and hand.  Normal sensory exam to the fingers.  Neurological: He is alert.  Skin: Skin is warm and dry.  Psychiatric: He has a normal mood and affect. His behavior is normal.     UC Treatments / Results  Labs (all labs ordered are listed, but only abnormal results are displayed) Labs Reviewed - No data  to display  EKG None  Radiology Dg Hand Complete Right  Result Date: 02/02/2018 CLINICAL DATA:  Larey SeatFell playing basketball this morning with right hand injury. Palm are pain. EXAM: RIGHT HAND - COMPLETE 3+ VIEW COMPARISON:  None. FINDINGS: The bones are subjectively adequately mineralized. There is no acute fracture nor dislocation. Specific attention to the metacarpals reveals no acute bony abnormality. The soft tissues are unremarkable. IMPRESSION: There is no acute or significant chronic bony abnormality of the right hand. Electronically Signed   By: David  SwazilandJordan M.D.   On: 02/02/2018 09:54    Procedures Procedures (including critical care time)  Medications Ordered in UC Medications - No data to display  Initial Impression / Assessment and Plan / UC Course  I have reviewed the triage vital signs and the nursing notes.  Pertinent labs & imaging results that were available during my care of the patient were reviewed by me and considered in my medical decision making (see chart for details).    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  He does not feel like he can push his hand.  I agree and I am giving him 2 days off of work.  Discussed conservative treatment.  Reasons for return. Final Clinical Impressions(s) / UC Diagnoses   Final diagnoses:  Contusion of right hand, initial encounter     Discharge Instructions     Use ice for 20 minutes every couple of hours today Elevate hand to reduce pain and swelling Take ibuprofen 3 times a day with food Limit use of hand while painful Off work until Monday   ED Prescriptions    Medication Sig Dispense Auth. Provider   ibuprofen (ADVIL,MOTRIN) 800 MG tablet Take 1 tablet (800 mg total) by mouth 3 (three) times daily. 21 tablet Eustace MooreNelson,  Sue, MD     Controlled Substance Prescriptions [place] Controlled Substance Registry consulted? Not Applicable   Eustace MooreNelson,  Sue, MD 02/02/18 1309

## 2018-02-02 NOTE — ED Triage Notes (Signed)
Pt. Complaining of right hand injury from playing basketball. The palm of the hand radiating down to wrist. 8/10 Pain

## 2018-02-02 NOTE — Discharge Instructions (Signed)
Use ice for 20 minutes every couple of hours today Elevate hand to reduce pain and swelling Take ibuprofen 3 times a day with food Limit use of hand while painful Off work until Monday

## 2018-05-09 ENCOUNTER — Encounter: Payer: Managed Care, Other (non HMO) | Admitting: Student in an Organized Health Care Education/Training Program

## 2018-11-04 ENCOUNTER — Emergency Department (HOSPITAL_BASED_OUTPATIENT_CLINIC_OR_DEPARTMENT_OTHER)
Admission: EM | Admit: 2018-11-04 | Discharge: 2018-11-04 | Disposition: A | Discharge status: Home / Self Care | Payer: Managed Care, Other (non HMO) | Attending: Emergency Medicine | Admitting: Emergency Medicine

## 2018-11-04 ENCOUNTER — Other Ambulatory Visit: Payer: Self-pay

## 2018-11-04 ENCOUNTER — Encounter (HOSPITAL_BASED_OUTPATIENT_CLINIC_OR_DEPARTMENT_OTHER): Payer: Self-pay | Admitting: Emergency Medicine

## 2018-11-04 DIAGNOSIS — J020 Streptococcal pharyngitis: Secondary | ICD-10-CM | POA: Diagnosis not present

## 2018-11-04 DIAGNOSIS — J029 Acute pharyngitis, unspecified: Secondary | ICD-10-CM | POA: Diagnosis present

## 2018-11-04 DIAGNOSIS — F17200 Nicotine dependence, unspecified, uncomplicated: Secondary | ICD-10-CM | POA: Diagnosis not present

## 2018-11-04 LAB — GROUP A STREP BY PCR: Group A Strep by PCR: DETECTED — AB

## 2018-11-04 MED ORDER — PENICILLIN G BENZATHINE 1200000 UNIT/2ML IM SUSP
1.2000 10*6.[IU] | Freq: Once | INTRAMUSCULAR | Status: AC
  Administered 2018-11-04: 1.2 10*6.[IU] via INTRAMUSCULAR
  Filled 2018-11-04: qty 2

## 2018-11-04 MED ORDER — ACETAMINOPHEN 500 MG PO TABS: 500.0000 mg | ORAL_TABLET | Freq: Four times a day (QID) | ORAL | 0 refills | Status: DC | PRN

## 2018-11-04 MED ORDER — ACETAMINOPHEN 325 MG PO TABS
650.0000 mg | ORAL_TABLET | Freq: Once | ORAL | Status: AC
  Administered 2018-11-04: 650 mg via ORAL
  Filled 2018-11-04: qty 2

## 2018-11-04 MED ORDER — LIDOCAINE VISCOUS HCL 2 % MT SOLN: 15.0000 mL | OROMUCOSAL | 0 refills | Status: DC | PRN

## 2018-11-04 NOTE — ED Triage Notes (Signed)
Pt reports sore throat, back pain, headaches, chills for a couple days. Reports noting white spots on the back of his throat.

## 2018-11-04 NOTE — ED Provider Notes (Addendum)
[facility] EMERGENCY DEPARTMENT Provider Note   CSN: [id number] Arrival date & time: 11/04/18  1955     History   Chief Complaint Chief Complaint  Patient presents with  . Sore Throat  . wants COVID test    HPI [name] J Mulvey is a 23 y.o. male.     The history is provided by the patient. No language interpreter was used.  Sore Throat     23 year old male presenting with sore throat.  For the past 2 to 3 days patient has had fever, chills, headache, sore throat, body aches, spots in the back of his throat.  Symptoms moderate in severity.  No report of cough.  Denies any recent sick contact.  He did take some TheraFlu last night that did help.  He denies any recent travel and no direct contact with anyone with COVID-19.  No prior history of strep throat.  No nausea vomiting or diarrhea.  Symptom is moderate in severity.  History reviewed. No pertinent past medical history.  Patient Active Problem List   Diagnosis Date Noted  . Tobacco use disorder 06/03/2016  . Hair loss 06/03/2016  . Low back pain 11/16/2011    History reviewed. No pertinent surgical history.      Home Medications    Prior to Admission medications   Medication Sig Start Date End Date Taking? Authorizing Provider  ibuprofen (ADVIL,MOTRIN) 800 MG tablet Take 1 tablet (800 mg total) by mouth 3 (three) times daily. 02/02/18  Yes Eustace MooreNelson, Yvonne Sue, MD    Family History Family History  Problem Relation Age of Onset  . Heart murmur Mother   . Anxiety disorder Mother   . Graves' disease Father   . Diabetes Maternal Grandfather   . Heart disease Neg Hx   . Stroke Neg Hx     Social History Social History   Tobacco Use  . Smoking status: Current Every Day Smoker  . Smokeless tobacco: Never Used  . Tobacco comment: black and mild  Substance Use Topics  . Alcohol use: No    Alcohol/week: 0.0 standard drinks  . Drug use: No     Allergies   Patient has no known allergies.    Review of Systems Review of Systems  All other systems reviewed and are negative.    Physical Exam Updated Vital Signs BP 110/77 (BP Location: Left Arm)   Pulse 85   Temp 99.5 F (37.5 C) (Oral)   Resp 16   Ht 5\' 11"  (1.803 m)   Wt 68 kg   SpO2 98%   BMI 20.92 kg/m   Physical Exam Vitals signs and nursing note reviewed.  Constitutional:      General: He is not in acute distress.    Appearance: He is well-developed.  HENT:     Head: Atraumatic.     Right Ear: Tympanic membrane normal.     Left Ear: Tympanic membrane normal.     Nose: No congestion or rhinorrhea.     Mouth/Throat:     Mouth: Mucous membranes are moist.     Pharynx: Uvula midline. Pharyngeal swelling, oropharyngeal exudate and posterior oropharyngeal erythema present. No uvula swelling.     Tonsils: Tonsillar exudate present. No tonsillar abscesses. 2+ on the right. 2+ on the left.  Eyes:     Conjunctiva/sclera: Conjunctivae normal.  Neck:     Musculoskeletal: Neck supple.  Cardiovascular:     Rate and Rhythm: Normal rate and regular rhythm.  Pulmonary:  Effort: Pulmonary effort is normal.     Breath sounds: Normal breath sounds. No wheezing, rhonchi or rales.  Abdominal:     Palpations: Abdomen is soft.     Tenderness: There is no abdominal tenderness.  Skin:    Findings: No rash.  Neurological:     Mental Status: He is alert.      ED Treatments / Results  Labs (all labs ordered are listed, but only abnormal results are displayed) Labs Reviewed  GROUP A STREP BY PCR - Abnormal; Notable for the following components:      Result Value   Group A Strep by PCR DETECTED (*)    All other components within normal limits    EKG None  Radiology No results found.  Procedures Procedures (including critical care time)  Medications Ordered in ED Medications  acetaminophen (TYLENOL) tablet 650 mg (650 mg Oral Given 11/04/18 2012)     Initial Impression / Assessment and Plan / ED Course   I have reviewed the triage vital signs and the nursing notes.  Pertinent labs & imaging results that were available during my care of the patient were reviewed by me and considered in my medical decision making (see chart for details).        BP 110/77 (BP Location: Left Arm)   Pulse 85   Temp 99.5 F (37.5 C) (Oral)   Resp 16   Ht 5\' 11"  (1.803 m)   Wt 68 kg   SpO2 98%   BMI 20.92 kg/m    Final Clinical Impressions(s) / ED Diagnoses   Final diagnoses:  Strep throat    ED Discharge Orders         Ordered    lidocaine (XYLOCAINE) 2 % solution  As needed     11/04/18 2215    acetaminophen (TYLENOL) 500 MG tablet  Every 6 hours PRN     11/04/18 2215         10:14 PM Patient presents with symptoms concerning for strep infection.  Strep test is positive for group A strep.  No evidence to suggest Ludwig angina or peritonsillar abscess or other deep tissue infection.  Low suspicion for COVID-19.  Will treat with Bicillin IM and will discharge home with symptomatic treatment.  ILYA NEELY was evaluated in Emergency Department on 11/04/2018 for the symptoms described in the history of present illness. He was evaluated in the context of the global COVID-19 pandemic, which necessitated consideration that the patient might be at risk for infection with the SARS-CoV-2 virus that causes COVID-19. Institutional protocols and algorithms that pertain to the evaluation of patients at risk for COVID-19 are in a state of rapid change based on information released by regulatory bodies including the CDC and federal and state organizations. These policies and algorithms were followed during the patient's care in the ED.    Domenic Moras, PA-C 11/04/18 2216    Domenic Moras, PA-C 11/04/18 2216    Tegeler, Gwenyth Allegra, MD 11/05/18 [phone]

## 2018-11-26 ENCOUNTER — Encounter (HOSPITAL_BASED_OUTPATIENT_CLINIC_OR_DEPARTMENT_OTHER): Payer: Self-pay | Admitting: *Deleted

## 2018-11-26 ENCOUNTER — Other Ambulatory Visit: Payer: Self-pay

## 2018-11-26 ENCOUNTER — Emergency Department (HOSPITAL_BASED_OUTPATIENT_CLINIC_OR_DEPARTMENT_OTHER)
Admission: EM | Admit: 2018-11-26 | Discharge: 2018-11-26 | Disposition: A | Discharge status: Home / Self Care | Payer: Managed Care, Other (non HMO) | Attending: Emergency Medicine | Admitting: Emergency Medicine

## 2018-11-26 ENCOUNTER — Emergency Department (HOSPITAL_BASED_OUTPATIENT_CLINIC_OR_DEPARTMENT_OTHER): Payer: Managed Care, Other (non HMO)

## 2018-11-26 DIAGNOSIS — M25571 Pain in right ankle and joints of right foot: Secondary | ICD-10-CM | POA: Insufficient documentation

## 2018-11-26 DIAGNOSIS — R2241 Localized swelling, mass and lump, right lower limb: Secondary | ICD-10-CM | POA: Diagnosis not present

## 2018-11-26 DIAGNOSIS — Z79899 Other long term (current) drug therapy: Secondary | ICD-10-CM | POA: Insufficient documentation

## 2018-11-26 DIAGNOSIS — F17200 Nicotine dependence, unspecified, uncomplicated: Secondary | ICD-10-CM | POA: Diagnosis not present

## 2018-11-26 NOTE — ED Provider Notes (Signed)
[facility] EMERGENCY DEPARTMENT Provider Note   CSN: [id number] Arrival date & time: 11/26/18  1234     History   Chief Complaint Chief Complaint  Patient presents with  . Ankle Pain    HPI Clinton Callahan is a 23 y.o. male without significant past medical history, presenting to the emergency department with complaint of right ankle pain that began this morning upon awakening.  He states he has had no particular injury.  The injury is to the lateral aspect of the right anterior ankle.  He states he had a little swelling this morning, however that resolved.  He took some Tylenol for his symptoms.  No fevers or redness.  No other injuries reported.     The history is provided by the patient.    History reviewed. No pertinent past medical history.  Patient Active Problem List   Diagnosis Date Noted  . Tobacco use disorder 06/03/2016  . Hair loss 06/03/2016  . Low back pain 11/16/2011    History reviewed. No pertinent surgical history.      Home Medications    Prior to Admission medications   Medication Sig Start Date End Date Taking? Authorizing Provider  acetaminophen (TYLENOL) 500 MG tablet Take 1 tablet (500 mg total) by mouth every 6 (six) hours as needed. 11/04/18   Fayrene Helperran, Bowie, PA-C  ibuprofen (ADVIL,MOTRIN) 800 MG tablet Take 1 tablet (800 mg total) by mouth 3 (three) times daily. 02/02/18   Eustace MooreNelson, Yvonne Sue, MD  lidocaine (XYLOCAINE) 2 % solution Use as directed 15 mLs in the mouth or throat as needed for mouth pain. 11/04/18   Fayrene Helperran, Bowie, PA-C    Family History Family History  Problem Relation Age of Onset  . Heart murmur Mother   . Anxiety disorder Mother   . Graves' disease Father   . Diabetes Maternal Grandfather   . Heart disease Neg Hx   . Stroke Neg Hx     Social History Social History   Tobacco Use  . Smoking status: Current Every Day Smoker  . Smokeless tobacco: Never Used  . Tobacco comment: black and mild  Substance Use Topics   . Alcohol use: No    Alcohol/week: 0.0 standard drinks  . Drug use: No     Allergies   Patient has no known allergies.   Review of Systems Review of Systems  Musculoskeletal: Positive for arthralgias and joint swelling.  Skin: Negative for color change.     Physical Exam Updated Vital Signs BP (!) 105/55 (BP Location: Right Arm)   Pulse (!) 56   Temp 99.3 F (37.4 C) (Oral)   Resp 20   Ht 6' (1.829 m)   Wt 72.6 kg   SpO2 100%   BMI 21.70 kg/m   Physical Exam Vitals signs and nursing note reviewed.  Constitutional:      General: He is not in acute distress.    Appearance: He is well-developed.  HENT:     Head: Normocephalic and atraumatic.  Eyes:     Conjunctiva/sclera: Conjunctivae normal.  Cardiovascular:     Rate and Rhythm: Normal rate.  Pulmonary:     Effort: Pulmonary effort is normal.  Musculoskeletal:     Comments: There is some tenderness to the right anterior lateral ankle.  There is no bony tenderness to the ankle.  No swelling, redness or deformity.  Patient is able to actively range the ankle without difficulty though does have some pain.  Normal distal sensation and  pulses.  No calf tenderness.  Neurological:     Mental Status: He is alert.  Psychiatric:        Mood and Affect: Mood normal.        Behavior: Behavior normal.      ED Treatments / Results  Labs (all labs ordered are listed, but only abnormal results are displayed) Labs Reviewed - No data to display  EKG None  Radiology Dg Ankle Complete Right  Result Date: 11/26/2018 CLINICAL DATA:  Pain and swelling. Difficulty bearing weight. No known injury. EXAM: RIGHT ANKLE - COMPLETE 3+ VIEW COMPARISON:  None. FINDINGS: There is no evidence of fracture, dislocation, or joint effusion. There is no evidence of arthropathy or other focal bone abnormality. Soft tissues are unremarkable. IMPRESSION: Negative. Electronically Signed   By: Kerby Moors M.D.   On: 11/26/2018 13:11     Procedures Procedures (including critical care time)  Medications Ordered in ED Medications - No data to display   Initial Impression / Assessment and Plan / ED Course  I have reviewed the triage vital signs and the nursing notes.  Pertinent labs & imaging results that were available during my care of the patient were reviewed by me and considered in my medical decision making (see chart for details).        Patient presenting with right ankle pain that began upon awakening.  No apparent injury.  X-rays negative.  Exam is reassuring, no redness, swelling or warmth to suggest septic joint.  Patient is able to range today joint and has more focal tenderness.  Will provide Ace wrap for compression and support, recommend NSAIDs and RICE therapy.  PCP follow-up recommended as needed if symptoms do not improve.  Discussed results, findings, treatment and follow up. Patient advised of return precautions. Patient verbalized understanding and agreed with plan.  Final Clinical Impressions(s) / ED Diagnoses   Final diagnoses:  Acute right ankle pain    ED Discharge Orders    None       Robinson, [place] N, PA-C 11/26/18 1556    Davonna Belling, MD 11/27/18 1247

## 2018-11-26 NOTE — Discharge Instructions (Addendum)
Please read instructions below. Apply ice to your ankle for 20 minutes at a time. You can take ibuprofen every 6 hours as needed for pain. Schedule an appointment with your primary care if symptoms do not improve. Return to the ER for new or concerning symptoms.

## 2018-11-26 NOTE — ED Notes (Signed)
Pt noted to be pacing around exam room without difficulty ambulating.

## 2018-11-26 NOTE — ED Triage Notes (Signed)
He woke with right ankle pain. No injury.

## 2019-02-06 ENCOUNTER — Encounter (HOSPITAL_BASED_OUTPATIENT_CLINIC_OR_DEPARTMENT_OTHER): Payer: Self-pay

## 2019-02-06 ENCOUNTER — Emergency Department (HOSPITAL_BASED_OUTPATIENT_CLINIC_OR_DEPARTMENT_OTHER)
Admission: EM | Admit: 2019-02-06 | Discharge: 2019-02-06 | Disposition: A | Discharge status: Home / Self Care | Payer: Managed Care, Other (non HMO) | Attending: Emergency Medicine | Admitting: Emergency Medicine

## 2019-02-06 ENCOUNTER — Other Ambulatory Visit: Payer: Self-pay

## 2019-02-06 DIAGNOSIS — R222 Localized swelling, mass and lump, trunk: Secondary | ICD-10-CM | POA: Diagnosis present

## 2019-02-06 DIAGNOSIS — L0231 Cutaneous abscess of buttock: Secondary | ICD-10-CM | POA: Insufficient documentation

## 2019-02-06 DIAGNOSIS — F17290 Nicotine dependence, other tobacco product, uncomplicated: Secondary | ICD-10-CM | POA: Diagnosis not present

## 2019-02-06 MED ORDER — LIDOCAINE-EPINEPHRINE 1 %-1:100000 IJ SOLN
INTRAMUSCULAR | Status: AC
  Filled 2019-02-06: qty 1

## 2019-02-06 MED ORDER — LIDOCAINE-EPINEPHRINE 1 %-1:100000 IJ SOLN
20.0000 mL | Freq: Once | INTRAMUSCULAR | Status: AC
  Administered 2019-02-06: 13:00:00 via INTRADERMAL

## 2019-02-06 MED ORDER — SULFAMETHOXAZOLE-TRIMETHOPRIM 800-160 MG PO TABS: 1.0000 | ORAL_TABLET | Freq: Two times a day (BID) | ORAL | 0 refills | Status: AC

## 2019-02-06 MED ORDER — LIDOCAINE-EPINEPHRINE 2 %-1:100000 IJ SOLN
20.0000 mL | Freq: Once | INTRAMUSCULAR | Status: DC
  Filled 2019-02-06: qty 20

## 2019-02-06 NOTE — ED Provider Notes (Signed)
[facility] Emergency Department Provider Note MRN:  [id number]  Arrival date & time: 02/06/19     Chief Complaint   Abscess   History of Present Illness   Clinton Callahan is a 23 y.o. year-old male with no pertinent past medical history presenting to the ED with chief complaint of abscess.  1 or 2 days of pain related to boil on the left buttocks.  Became very painful this morning, trouble sitting.  Denies fever.  Review of Systems  A problem-focused ROS was performed. Positive for abscess.  Patient denies fever.  Patient's Health History   History reviewed. No pertinent past medical history.  History reviewed. No pertinent surgical history.  Family History  Problem Relation Age of Onset  . Heart murmur Mother   . Anxiety disorder Mother   . Graves' disease Father   . Diabetes Maternal Grandfather   . Heart disease Neg Hx   . Stroke Neg Hx     Social History   Socioeconomic History  . Marital status: Single    Spouse name: Not on file  . Number of children: Not on file  . Years of education: Not on file  . Highest education level: Not on file  Occupational History  . Not on file  Social Needs  . Financial resource strain: Not on file  . Food insecurity    Worry: Not on file    Inability: Not on file  . Transportation needs    Medical: Not on file    Non-medical: Not on file  Tobacco Use  . Smoking status: Current Every Day Smoker    Types: Cigars  . Smokeless tobacco: Never Used  Substance and Sexual Activity  . Alcohol use: No    Alcohol/week: 0.0 standard drinks  . Drug use: No  . Sexual activity: Not on file  Lifestyle  . Physical activity    Days per week: Not on file    Minutes per session: Not on file  . Stress: Not on file  Relationships  . Social Herbalist on phone: Not on file    Gets together: Not on file    Attends religious service: Not on file    Active member of club or organization: Not on  file    Attends meetings of clubs or organizations: Not on file    Relationship status: Not on file  . Intimate partner violence    Fear of current or ex partner: Not on file    Emotionally abused: Not on file    Physically abused: Not on file    Forced sexual activity: Not on file  Other Topics Concern  . Not on file  Social History Narrative   11th grade, lives with mom, grandma and sister     Physical Exam  Vital Signs and Nursing Notes reviewed Vitals:   02/06/19 1157  BP: 118/69  Pulse: 75  Resp: 18  Temp: 98.8 F (37.1 C)  SpO2: 100%    CONSTITUTIONAL: Well-appearing, NAD NEURO:  Alert and oriented x 3, no focal deficits EYES:  eyes equal and reactive ENT/NECK:  no LAD, no JVD CARDIO: Regular rate, well-perfused, normal S1 and S2 PULM:  CTAB no wheezing or rhonchi GI/GU:  normal bowel sounds, non-distended, non-tender MSK/SPINE:  No gross deformities, no edema SKIN: 3 cm fluctuant nodule to the left buttocks near the gluteal crease, mild surrounding induration PSYCH:  Appropriate speech and behavior  Diagnostic and Interventional Summary  EKG Interpretation  Date/Time:    Ventricular Rate:    PR Interval:    QRS Duration:   QT Interval:    QTC Calculation:   R Axis:     Text Interpretation:        Labs Reviewed - No data to display  No orders to display    Medications  lidocaine-EPINEPHrine (XYLOCAINE W/EPI) 2 %-1:100000 (with pres) injection 20 mL (has no administration in time range)  lidocaine-EPINEPHrine (XYLOCAINE W/EPI) 1 %-1:100000 (with pres) injection (has no administration in time range)     Procedures  /  Critical Care .Marland KitchenIncision and Drainage  Date/Time: 02/06/2019 12:51 PM Performed by: Sabas Sous, MD Authorized by: Sabas Sous, MD   Consent:    Consent obtained:  Verbal   Consent given by:  Patient   Risks discussed:  Bleeding, damage to other organs, incomplete drainage, infection and pain Location:    Type:   Abscess   Size:  3cm   Location:  Anogenital   Anogenital location:  Gluteal cleft Pre-procedure details:    Skin preparation:  Chloraprep Anesthesia (see MAR for exact dosages):    Anesthesia method:  Local infiltration   Local anesthetic:  Lidocaine 1% WITH epi Procedure type:    Complexity:  Simple Procedure details:    Incision types:  Single straight   Incision depth:  Submucosal   Scalpel blade:  11   Wound management:  Probed and deloculated and irrigated with saline   Drainage:  Purulent   Drainage amount:  Copious   Packing materials:  None Post-procedure details:    Patient tolerance of procedure:  Tolerated well, no immediate complications    ED Course and Medical Decision Making  I have reviewed the triage vital signs and the nursing notes.  Pertinent labs & imaging results that were available during my care of the patient were reviewed by me and considered in my medical decision making (see below for details).     Uncomplicated abscess drained as described above.  Some degree of surrounding induration, has had abscess such as this in the past, will cover with Bactrim.  Elmer Sow. Pilar Plate, MD [facility] Emergency Medicine [facility] [email or web address]  Final Clinical Impressions(s) / ED Diagnoses     ICD-10-CM   1. Abscess of buttock, left  L02.31     ED Discharge Orders         Ordered    sulfamethoxazole-trimethoprim (BACTRIM DS) 800-160 MG tablet  2 times daily     02/06/19 1247           Discharge Instructions Discussed with and Provided to Patient:     Discharge Instructions     You were evaluated in the Emergency Department and after careful evaluation, we did not find any emergent condition requiring admission or further testing in the hospital.  Your exam/testing today was overall reassuring.  We performed an incision and drainage of your abscess here in the emergency department.  Please take the antibiotics as directed.   Please return to the Emergency Department if you experience any worsening of your condition.  We encourage you to follow up with a primary care provider.  Thank you for allowing [place] to be a part of your care.        Sabas Sous, MD 02/06/19 1252

## 2019-02-06 NOTE — ED Triage Notes (Signed)
Pt c/o abscess to left buttock area x 3 days-NAD-steady gait

## 2019-02-06 NOTE — ED Notes (Signed)
ED Provider at bedside. 

## 2019-02-06 NOTE — Discharge Instructions (Addendum)
You were evaluated in the Emergency Department and after careful evaluation, we did not find any emergent condition requiring admission or further testing in the hospital.  Your exam/testing today was overall reassuring.  We performed an incision and drainage of your abscess here in the emergency department.  Please take the antibiotics as directed.  Please return to the Emergency Department if you experience any worsening of your condition.  We encourage you to follow up with a primary care provider.  Thank you for allowing [place] to be a part of your care.

## 2020-05-06 DIAGNOSIS — Z7689 Persons encountering health services in other specified circumstances: Secondary | ICD-10-CM | POA: Insufficient documentation

## 2020-05-06 NOTE — Progress Notes (Signed)
Patient missed his appointment at the family practice center to reestablish care with a PCP.  Unfortunately, per clinic policy since patient missed his appointment to reestablish care has to reapply to become a patient with a family practice center.   Peggyann Shoals, DO [facility] Family Medicine, PGY-3 05/07/2020 12:55 PM

## 2020-05-07 ENCOUNTER — Ambulatory Visit (INDEPENDENT_AMBULATORY_CARE_PROVIDER_SITE_OTHER): Payer: Self-pay | Admitting: Family Medicine

## 2020-05-07 DIAGNOSIS — Z91199 Patient's noncompliance with other medical treatment and regimen due to unspecified reason: Secondary | ICD-10-CM | POA: Insufficient documentation

## 2020-05-07 DIAGNOSIS — Z5329 Procedure and treatment not carried out because of patient's decision for other reasons: Secondary | ICD-10-CM | POA: Insufficient documentation

## 2020-05-07 DIAGNOSIS — Z7689 Persons encountering health services in other specified circumstances: Secondary | ICD-10-CM

## 2020-05-11 ENCOUNTER — Encounter (HOSPITAL_COMMUNITY): Payer: Self-pay | Admitting: *Deleted

## 2020-05-11 ENCOUNTER — Ambulatory Visit (INDEPENDENT_AMBULATORY_CARE_PROVIDER_SITE_OTHER): Payer: Self-pay

## 2020-05-11 ENCOUNTER — Other Ambulatory Visit: Payer: Self-pay

## 2020-05-11 ENCOUNTER — Ambulatory Visit (HOSPITAL_COMMUNITY)
Admission: EM | Admit: 2020-05-11 | Discharge: 2020-05-11 | Disposition: A | Discharge status: Home / Self Care | Payer: Self-pay | Attending: Internal Medicine | Admitting: Internal Medicine

## 2020-05-11 ENCOUNTER — Ambulatory Visit (HOSPITAL_COMMUNITY): Admission: EM | Admit: 2020-05-11 | Discharge: 2020-05-11 | Discharge status: Absent without leave | Payer: Self-pay

## 2020-05-11 DIAGNOSIS — Y9367 Activity, basketball: Secondary | ICD-10-CM

## 2020-05-11 DIAGNOSIS — S99921A Unspecified injury of right foot, initial encounter: Secondary | ICD-10-CM

## 2020-05-11 DIAGNOSIS — M25541 Pain in joints of right hand: Secondary | ICD-10-CM

## 2020-05-11 DIAGNOSIS — M7989 Other specified soft tissue disorders: Secondary | ICD-10-CM

## 2020-05-11 DIAGNOSIS — M79641 Pain in right hand: Secondary | ICD-10-CM

## 2020-05-11 DIAGNOSIS — M79671 Pain in right foot: Secondary | ICD-10-CM

## 2020-05-11 DIAGNOSIS — S6991XA Unspecified injury of right wrist, hand and finger(s), initial encounter: Secondary | ICD-10-CM

## 2020-05-11 MED ORDER — IBUPROFEN 600 MG PO TABS: 600.0000 mg | ORAL_TABLET | Freq: Four times a day (QID) | ORAL | 0 refills | Status: DC | PRN

## 2020-05-11 NOTE — ED Provider Notes (Signed)
[facility]    CSN: [id number] Arrival date & time: 05/11/20  1054      History   Chief Complaint Chief Complaint  Patient presents with  . Hand Injury  . Foot Injury    HPI Clinton Callahan is a 25 y.o. male.   Patient presents with pain in his right hand and right foot after injuring these areas while playing basketball.  He states he fell while playing basketball then was mad and punched a wall with his right hand.  He denies head injury or loss of consciousness.  He denies numbness, weakness, paresthesias, open wounds, redness, bruising, or other symptoms.  No treatments attempted at home.  His medical history includes tobacco use disorder and low back pain.  The history is provided by the patient and medical records.    History reviewed. No pertinent past medical history.  Patient Active Problem List   Diagnosis Date Noted  . No-show for appointment 05/07/2020  . Encounter to establish care with new doctor 05/06/2020  . Tobacco use disorder 06/03/2016  . Hair loss 06/03/2016  . Low back pain 11/16/2011    History reviewed. No pertinent surgical history.     Home Medications    Prior to Admission medications   Medication Sig Start Date End Date Taking? Authorizing Provider  ibuprofen (ADVIL) 600 MG tablet Take 1 tablet (600 mg total) by mouth every 6 (six) hours as needed. 05/11/20  Yes Mickie Bail, NP    Family History Family History  Problem Relation Age of Onset  . Heart murmur Mother   . Anxiety disorder Mother   . Graves' disease Father   . Diabetes Maternal Grandfather   . Heart disease Neg Hx   . Stroke Neg Hx     Social History Social History   Tobacco Use  . Smoking status: Current Every Day Smoker    Types: Cigars  . Smokeless tobacco: Never Used  Vaping Use  . Vaping Use: Never used  Substance Use Topics  . Alcohol use: No    Alcohol/week: 0.0 standard drinks  . Drug use: No     Allergies   Patient has no known  allergies.   Review of Systems Review of Systems  Constitutional: Negative for chills and fever.  HENT: Negative for ear pain and sore throat.   Eyes: Negative for pain and visual disturbance.  Respiratory: Negative for cough and shortness of breath.   Cardiovascular: Negative for chest pain and palpitations.  Gastrointestinal: Negative for abdominal pain and vomiting.  Genitourinary: Negative for dysuria and hematuria.  Musculoskeletal: Positive for arthralgias. Negative for back pain.  Skin: Negative for color change and rash.  Neurological: Negative for syncope, weakness and numbness.  All other systems reviewed and are negative.    Physical Exam Triage Vital Signs ED Triage Vitals  Enc Vitals Group     BP      Pulse      Resp      Temp      Temp src      SpO2      Weight      Height      Head Circumference      Peak Flow      Pain Score      Pain Loc      Pain Edu?      Excl. in GC?    No data found.  Updated Vital Signs BP (!) 118/44 (BP Location: Left Arm)  Pulse 91   Temp 98 F (36.7 C) (Oral)   Resp 16   SpO2 97%   Visual Acuity Right Eye Distance:   Left Eye Distance:   Bilateral Distance:    Right Eye Near:   Left Eye Near:    Bilateral Near:     Physical Exam Vitals and nursing note reviewed.  Constitutional:      General: He is not in acute distress.    Appearance: He is well-developed and well-nourished. He is not ill-appearing.  HENT:     Head: Normocephalic and atraumatic.     Mouth/Throat:     Mouth: Mucous membranes are moist.  Eyes:     Conjunctiva/sclera: Conjunctivae normal.  Cardiovascular:     Rate and Rhythm: Normal rate and regular rhythm.     Heart sounds: Normal heart sounds.  Pulmonary:     Effort: Pulmonary effort is normal. No respiratory distress.     Breath sounds: Normal breath sounds.  Abdominal:     Palpations: Abdomen is soft.     Tenderness: There is no abdominal tenderness.  Musculoskeletal:         General: Swelling and tenderness present. No deformity or edema. Normal range of motion.       Hands:     Cervical back: Neck supple.       Feet:     Comments: Mild tenderness and swelling to right hand across MCPs.  Skin:    General: Skin is warm and dry.     Capillary Refill: Capillary refill takes less than 2 seconds.     Findings: No bruising, erythema, lesion or rash.  Neurological:     General: No focal deficit present.     Mental Status: He is alert and oriented to person, place, and time.     Sensory: No sensory deficit.     Motor: No weakness.     Gait: Gait normal.  Psychiatric:        Mood and Affect: Mood and affect and mood normal.        Behavior: Behavior normal.      UC Treatments / Results  Labs (all labs ordered are listed, but only abnormal results are displayed) Labs Reviewed - No data to display  EKG   Radiology DG Hand Complete Right  Result Date: 05/11/2020 CLINICAL DATA:  Right hand pain and swelling after injury yesterday. EXAM: RIGHT HAND - COMPLETE 3+ VIEW COMPARISON:  None. FINDINGS: There is no evidence of fracture or dislocation. There is no evidence of arthropathy or other focal bone abnormality. Soft tissues are unremarkable. IMPRESSION: Negative. Electronically Signed   By: Lupita Raider M.D.   On: 05/11/2020 11:42   DG Foot Complete Right  Result Date: 05/11/2020 CLINICAL DATA:  Injured right lateral foot while playing basketball yesterday, EXAM: RIGHT FOOT COMPLETE - 3+ VIEW COMPARISON:  Right foot radiograph June 12, 2017. FINDINGS: There is no evidence of fracture or dislocation. There is no evidence of arthropathy or other focal bone abnormality. Os trigonum. Soft tissues are unremarkable. IMPRESSION: No acute abnormality on these nonweightbearing views of the right foot. Electronically Signed   By: Maudry Mayhew MD   On: 05/11/2020 12:14    Procedures Procedures (including critical care time)  Medications Ordered in  UC Medications - No data to display  Initial Impression / Assessment and Plan / UC Course  I have reviewed the triage vital signs and the nursing notes.  Pertinent labs & imaging results that  were available during my care of the patient were reviewed by me and considered in my medical decision making (see chart for details).   Hand pain, right foot pain.  X-rays negative.  Treating with ibuprofen, rest, elevation, ice packs.  Instructed patient to follow-up with his PCP or an orthopedist if his symptoms are not improving.  He agrees to plan of care.   Final Clinical Impressions(s) / UC Diagnoses   Final diagnoses:  Right hand pain  Right foot pain     Discharge Instructions     Take ibuprofen as needed for discomfort.    Follow up with your primary care provider or an orthopedist if your symptoms are not improving.         ED Prescriptions    Medication Sig Dispense Auth. Provider   ibuprofen (ADVIL) 600 MG tablet Take 1 tablet (600 mg total) by mouth every 6 (six) hours as needed. 30 tablet Mickie Bail, NP     PDMP not reviewed this encounter.   Mickie Bail, NP 05/11/20 1227

## 2020-05-11 NOTE — Discharge Instructions (Signed)
Take ibuprofen as needed for discomfort.    Follow up with your primary care provider or an orthopedist if your symptoms are not improving.

## 2020-05-11 NOTE — ED Triage Notes (Signed)
Pt injured Rt hand and Rt lateral foot yesterday playing basketball. Swelling observered to rt hand .

## 2020-05-15 ENCOUNTER — Encounter (HOSPITAL_COMMUNITY): Payer: Self-pay | Admitting: *Deleted

## 2020-10-09 ENCOUNTER — Other Ambulatory Visit: Payer: Self-pay

## 2020-10-09 ENCOUNTER — Emergency Department (HOSPITAL_BASED_OUTPATIENT_CLINIC_OR_DEPARTMENT_OTHER): Payer: Self-pay

## 2020-10-09 ENCOUNTER — Encounter (HOSPITAL_BASED_OUTPATIENT_CLINIC_OR_DEPARTMENT_OTHER): Payer: Self-pay | Admitting: Emergency Medicine

## 2020-10-09 ENCOUNTER — Emergency Department (HOSPITAL_BASED_OUTPATIENT_CLINIC_OR_DEPARTMENT_OTHER)
Admission: EM | Admit: 2020-10-09 | Discharge: 2020-10-09 | Disposition: A | Discharge status: Home / Self Care | Payer: Self-pay | Attending: Emergency Medicine | Admitting: Emergency Medicine

## 2020-10-09 DIAGNOSIS — M25532 Pain in left wrist: Secondary | ICD-10-CM | POA: Insufficient documentation

## 2020-10-09 DIAGNOSIS — F17290 Nicotine dependence, other tobacco product, uncomplicated: Secondary | ICD-10-CM | POA: Insufficient documentation

## 2020-10-09 MED ORDER — NAPROXEN SODIUM 220 MG PO TABS: 220.0000 mg | ORAL_TABLET | Freq: Two times a day (BID) | ORAL | 0 refills | Status: DC | PRN

## 2020-10-09 NOTE — ED Provider Notes (Signed)
[facility] EMERGENCY DEPARTMENT Provider Note   CSN: [id number] Arrival date & time: 10/09/20  1000     History Chief Complaint  Patient presents with   Wrist Pain    FISCHER HALLEY is a 25 y.o. male.  The history is provided by the patient. No language interpreter was used.  Wrist Pain This is a new problem. The current episode started 2 days ago. The problem occurs constantly. The problem has not changed since onset.Pertinent negatives include no chest pain, no abdominal pain, no headaches and no shortness of breath. Nothing aggravates the symptoms. Nothing relieves the symptoms. He has tried nothing for the symptoms. The treatment provided no relief.      History reviewed. No pertinent past medical history.  Patient Active Problem List   Diagnosis Date Noted   No-show for appointment 05/07/2020   Encounter to establish care with new doctor 05/06/2020   Tobacco use disorder 06/03/2016   Hair loss 06/03/2016   Low back pain 11/16/2011    History reviewed. No pertinent surgical history.     Family History  Problem Relation Age of Onset   Heart murmur Mother    Anxiety disorder Mother    Luiz Blare' disease Father    Diabetes Maternal Grandfather    Heart disease Neg Hx    Stroke Neg Hx     Social History   Tobacco Use   Smoking status: Every Day    Types: Cigars   Smokeless tobacco: Never  Vaping Use   Vaping Use: Never used  Substance Use Topics   Alcohol use: No    Alcohol/week: 0.0 standard drinks   Drug use: No    Home Medications Prior to Admission medications   Medication Sig Start Date End Date Taking? Authorizing Provider  ibuprofen (ADVIL) 600 MG tablet Take 1 tablet (600 mg total) by mouth every 6 (six) hours as needed. 05/11/20   Mickie Bail, NP    Allergies    Patient has no known allergies.  Review of Systems   Review of Systems  Constitutional:  Negative for chills, diaphoresis, fatigue and fever.  HENT:  Negative for  congestion.   Respiratory:  Negative for cough, chest tightness, shortness of breath and wheezing.   Cardiovascular:  Negative for chest pain.  Gastrointestinal:  Negative for abdominal pain, constipation, diarrhea, nausea and vomiting.  Musculoskeletal:  Negative for back pain.  Skin:  Negative for color change, rash and wound.  Neurological:  Negative for weakness, light-headedness, numbness and headaches.  Psychiatric/Behavioral:  Negative for agitation.   All other systems reviewed and are negative.  Physical Exam Updated Vital Signs BP 127/78   Pulse (!) 50   Temp 98.9 F (37.2 C) (Oral)   Resp 18   Ht 6' (1.829 m)   Wt 68 kg   SpO2 100%   BMI 20.34 kg/m   Physical Exam Vitals and nursing note reviewed.  Constitutional:      General: He is not in acute distress.    Appearance: He is well-developed. He is not ill-appearing, toxic-appearing or diaphoretic.  HENT:     Head: Normocephalic and atraumatic.  Eyes:     Conjunctiva/sclera: Conjunctivae normal.  Cardiovascular:     Rate and Rhythm: Normal rate and regular rhythm.     Heart sounds: No murmur heard. Pulmonary:     Effort: Pulmonary effort is normal. No respiratory distress.     Breath sounds: Normal breath sounds.  Abdominal:     Palpations:  Abdomen is soft.     Tenderness: There is no abdominal tenderness.  Musculoskeletal:        General: Tenderness present. No swelling.     Right wrist: Tenderness present. No swelling, deformity, effusion, lacerations, snuff box tenderness or crepitus. Normal pulse.     Cervical back: Neck supple.     Right lower leg: No edema.     Left lower leg: No edema.     Comments: Tenderness in the right wrist with movement.  No snuffbox tenderness.  Normal grip strength, sensation, and pulses.  No warmth, erythema, or crepitus.  Skin:    General: Skin is warm and dry.     Capillary Refill: Capillary refill takes less than 2 seconds.     Coloration: Skin is not pale.      Findings: No bruising, erythema or rash.  Neurological:     General: No focal deficit present.     Mental Status: He is alert.     Sensory: No sensory deficit.     Motor: No weakness.    ED Results / Procedures / Treatments   Labs (all labs ordered are listed, but only abnormal results are displayed) Labs Reviewed - No data to display  EKG None  Radiology DG Wrist Complete Right  Result Date: 10/09/2020 CLINICAL DATA:  Wrist pain EXAM: RIGHT WRIST - COMPLETE 3+ VIEW COMPARISON:  Right hand radiograph 05/11/2020 FINDINGS: There is no evidence of fracture or dislocation. There is no evidence of arthropathy or other focal bone abnormality. Soft tissues are unremarkable. IMPRESSION: Negative right wrist radiographs. Electronically Signed   By: Caprice Renshaw   On: 10/09/2020 12:13    Procedures Procedures   Medications Ordered in ED Medications - No data to display  ED Course  I have reviewed the triage vital signs and the nursing notes.  Pertinent labs & imaging results that were available during my care of the patient were reviewed by me and considered in my medical decision making (see chart for details).    MDM Rules/Calculators/A&P                           DIOR STEPTER is a 25 y.o. male with no significant past medical history who presents with right wrist pain.  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  It is a maneuver he is not used to performing.  He reports he is having wrist soreness especially recreating the movement that he was doing this week.  He denies any persistent numbness or tingling.  Denies any weakness.  Denies any pain in his elbow or shoulder.  Denies any fevers, chills, cough urinary symptoms or GI symptoms.  Denies any focal trauma.  Denies any redness or swelling.  Denies any personal history of gout or joint infection.  On exam, patient has tenderness in  the right wrist especially with flexion and extension.  No snuffbox tenderness.  Normal grip strength, sensation, and pulses.  Normal capillary refill.  Full range of motion of fingers.  Exam otherwise unremarkable.  Clinically suspect overuse injury and soreness due to performing a maneuver is not used to doing repetitively.  However, given the reported discomfort, will get x-ray to look for acute bony abnormality however if it is negative, anticipate prescription for anti-inflammatory medication and wrist brace and follow-up with a sports medicine  provider/PCP.  Given lack of warmth or erythema, low suspicion for septic joint or gout at this time.  Patient agrees.     X-rays reassuring.  Patient will be placed in a wrist brace and follow-up with sports medicine.  Patient will use Aleve and use ice and do some stretches.  He agrees with plan of care and was felt stable for discharge home.  Patient discharged in good condition.   Final Clinical Impression(s) / ED Diagnoses Final diagnoses:  Acute pain of left wrist    Rx / DC Orders ED Discharge Orders          Ordered    naproxen sodium (ALEVE) 220 MG tablet  2 times daily PRN        10/09/20 1318            Clinical Impression: 1. Acute pain of left wrist     Disposition: Discharge  Condition: Good  I have discussed the results, Dx and Tx plan with the pt(& family if present). He/she/they expressed understanding and agree(s) with the plan. Discharge instructions discussed at great length. Strict return precautions discussed and pt &/or family have verbalized understanding of the instructions. No further questions at time of discharge.    New Prescriptions   NAPROXEN SODIUM (ALEVE) 220 MG TABLET    Take 1 tablet (220 mg total) by mouth 2 (two) times daily as needed. May take 2 tabs for initial dose than one tablet every 6-8 hours as needed.    Follow Up: Myra Rude, MD [address] [place]  [address] [phone]     Wanda Plump, MD [address] [place] [address] [phone]     [facility] [address] [id number] NA TFTD [place] [address] [phone]       Nikos Anglemyer, Canary Brim, MD 10/09/20 1324

## 2020-10-09 NOTE — Discharge Instructions (Addendum)
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  Please use the brace and rested for the next few days.  You may use ice and the anti-inflammatory medication.  Please follow-up with sports medicine and a PCP.  Several times a day, please take it out and do some stretches on the wrist.  Please rest and stay hydrated.  If any symptoms change or worsen acutely, please return to the nearest emergency department.

## 2020-10-09 NOTE — ED Triage Notes (Signed)
Patient complaining of right wrist pain starting 10/07/20. Unable to link to an injury.

## 2021-08-17 ENCOUNTER — Emergency Department (HOSPITAL_BASED_OUTPATIENT_CLINIC_OR_DEPARTMENT_OTHER)
Admission: EM | Admit: 2021-08-17 | Discharge: 2021-08-17 | Disposition: A | Discharge status: Home / Self Care | Payer: Self-pay | Attending: Emergency Medicine | Admitting: Emergency Medicine

## 2021-08-17 ENCOUNTER — Other Ambulatory Visit: Payer: Self-pay

## 2021-08-17 ENCOUNTER — Emergency Department (HOSPITAL_BASED_OUTPATIENT_CLINIC_OR_DEPARTMENT_OTHER): Payer: Self-pay

## 2021-08-17 DIAGNOSIS — S6992XA Unspecified injury of left wrist, hand and finger(s), initial encounter: Secondary | ICD-10-CM | POA: Insufficient documentation

## 2021-08-17 DIAGNOSIS — S63651A Sprain of metacarpophalangeal joint of left index finger, initial encounter: Secondary | ICD-10-CM

## 2021-08-17 DIAGNOSIS — Y9389 Activity, other specified: Secondary | ICD-10-CM | POA: Insufficient documentation

## 2021-08-17 MED ORDER — ACETAMINOPHEN 325 MG PO TABS: 650.0000 mg | ORAL_TABLET | Freq: Four times a day (QID) | ORAL | 0 refills | Status: AC | PRN

## 2021-08-17 MED ORDER — ACETAMINOPHEN 325 MG PO TABS
650.0000 mg | ORAL_TABLET | Freq: Once | ORAL | Status: AC
  Administered 2021-08-17: 650 mg via ORAL
  Filled 2021-08-17: qty 2

## 2021-08-17 MED ORDER — IBUPROFEN 600 MG PO TABS: 600.0000 mg | ORAL_TABLET | Freq: Four times a day (QID) | ORAL | 0 refills | Status: DC | PRN

## 2021-08-17 MED ORDER — IBUPROFEN 800 MG PO TABS
800.0000 mg | ORAL_TABLET | Freq: Once | ORAL | Status: AC
  Administered 2021-08-17: 800 mg via ORAL
  Filled 2021-08-17: qty 1

## 2021-08-17 NOTE — ED Notes (Signed)
AVS reviewed with client, work note also provided by ED MD. Pt teaching done re: pain control of using Tylenol and Ibuprofen, also using ice pack and elevation. Pt was encouraged to make a follow up appt as recommended by the ED MD. Opportunity for questions provided prior to DC to home

## 2021-08-17 NOTE — ED Provider Notes (Signed)
[facility] EMERGENCY DEPARTMENT Provider Note   CSN: [id number] Arrival date & time: 08/17/21  3474     History  Chief Complaint  Patient presents with   Hand Pain    Clinton Callahan is a 26 y.o. male.  Patient as above with significant medical history as below, including no significant chronic medical conditions who presents to the ED with complaint of left hand pain.  He is right-hand dominant.  Reports he was roughhousing with feeling numbers last night and injured his index finger.  Pain to the knuckle of his second digit.Marland Kitchen  No wrist or elbow or shoulder injury.  No medications prior to arrival.  No history of prior orthopedic surgery on this hand.  No numbness or tingling.     No past medical history on file.  No past surgical history on file.   The history is provided by the patient. No language interpreter was used.  Hand Pain Pertinent negatives include no chest pain, no abdominal pain, no headaches and no shortness of breath.      Home Medications Prior to Admission medications   Medication Sig Start Date End Date Taking? Authorizing Provider  ibuprofen (ADVIL) 600 MG tablet Take 1 tablet (600 mg total) by mouth every 6 (six) hours as needed. 05/11/20   Mickie Bail, NP  naproxen sodium (ALEVE) 220 MG tablet Take 1 tablet (220 mg total) by mouth 2 (two) times daily as needed. May take 2 tabs for initial dose than one tablet every 6-8 hours as needed. 10/09/20   Tegeler, Canary Brim, MD      Allergies    Patient has no known allergies.    Review of Systems   Review of Systems  Constitutional:  Negative for chills and fever.  HENT:  Negative for facial swelling and trouble swallowing.   Eyes:  Negative for photophobia and visual disturbance.  Respiratory:  Negative for cough and shortness of breath.   Cardiovascular:  Negative for chest pain and palpitations.  Gastrointestinal:  Negative for abdominal pain, nausea and vomiting.  Endocrine: Negative  for polydipsia and polyuria.  Genitourinary:  Negative for difficulty urinating and hematuria.  Musculoskeletal:  Positive for arthralgias. Negative for gait problem and joint swelling.  Skin:  Negative for pallor and rash.  Neurological:  Negative for syncope and headaches.  Psychiatric/Behavioral:  Negative for agitation and confusion.    Physical Exam Updated Vital Signs BP 132/76 (BP Location: Right Arm)   Pulse 76   Temp 98.3 F (36.8 C) (Oral)   Resp 16   Ht 6' (1.829 m)   Wt 68 kg   SpO2 98%   BMI 20.34 kg/m  Physical Exam Vitals and nursing note reviewed.  Constitutional:      General: He is not in acute distress.    Appearance: He is well-developed.  HENT:     Head: Normocephalic and atraumatic.     Right Ear: External ear normal.     Left Ear: External ear normal.     Mouth/Throat:     Mouth: Mucous membranes are moist.  Eyes:     General: No scleral icterus. Cardiovascular:     Rate and Rhythm: Normal rate and regular rhythm.     Pulses: Normal pulses.     Heart sounds: Normal heart sounds.  Pulmonary:     Effort: Pulmonary effort is normal. No respiratory distress.     Breath sounds: Normal breath sounds.  Abdominal:     General: Abdomen  is flat.     Palpations: Abdomen is soft.     Tenderness: There is no abdominal tenderness.  Musculoskeletal:        General: Normal range of motion.       Hands:     Cervical back: Normal range of motion.     Right lower leg: No edema.     Left lower leg: No edema.     Comments: Left hand is neurovascularly intact to radial, ulnar and median nerve distributions.  Radial pulse 2+  No pain on palpation to proximal wrist, elbow or shoulder.  Range of motion to wrist shoulder elbow.  Mild wrist range of motion to second digit left hand secondary to discomfort.  There is minimal swelling to proximal phalanx of second digit, no skin disruption, no obvious wound.  Capillary re fill is brisk distal to injury  No no wrist  pain, no snuffbox pain  Skin:    General: Skin is warm and dry.     Capillary Refill: Capillary refill takes less than 2 seconds.  Neurological:     Mental Status: He is alert and oriented to person, place, and time.     GCS: GCS eye subscore is 4. GCS verbal subscore is 5. GCS motor subscore is 6.  Psychiatric:        Mood and Affect: Mood normal.        Behavior: Behavior normal.    ED Results / Procedures / Treatments   Labs (all labs ordered are listed, but only abnormal results are displayed) Labs Reviewed - No data to display  EKG None  Radiology DG Hand Complete Left  Result Date: 08/17/2021 CLINICAL DATA:  LEFT hand swelling, injured playing, difficulty opening hand and spreading fingers, trauma EXAM: LEFT HAND - COMPLETE 3+ VIEW COMPARISON:  None FINDINGS: Minimal soft tissue swelling. Osseous mineralization normal. Joint spaces preserved. No acute fracture, dislocation, or bone destruction. IMPRESSION: No acute osseous abnormalities. Electronically Signed   By: Ulyses SouthwardMark  Boles M.D.   On: 08/17/2021 08:09    Procedures Procedures    Medications Ordered in ED Medications  ibuprofen (ADVIL) tablet 800 mg (has no administration in time range)  acetaminophen (TYLENOL) tablet 650 mg (has no administration in time range)    ED Course/ Medical Decision Making/ A&P                           Medical Decision Making Amount and/or Complexity of Data Reviewed Radiology: ordered.    CC: Left hand injury  This patient presents to the Emergency Department for the above complaint. This involves an extensive number of treatment options and is a complaint that carries with it a high risk of complications and morbidity. Vital signs were reviewed. Serious etiologies considered.  DDx includes not limited to strain, sprain, ligamentous injury, soft tissue injury, contusion, fracture, other acute pathologies  Record review:  Previous records obtained and reviewed  Prior ED visits,  prior labs and imaging, prior notes  Additional history obtained from N/A  Medical and surgical history as noted above.   Work up as above, notable for:  Labs & imaging results that were available during my care of the patient were visualized by me and considered in my medical decision making.   I ordered imaging studies which included a complete x-ray. I visualized the imaging, interpreted images, and I agree with radiologist interpretation.  No fracture  Management: Motrin, Tylenol, ice, also buddy tape  Reassessment:  Feeling better.  [name] negative.  Likely with sprain of second digit.  Applied buddy tape.  NSAIDs/Tylenol for home.  Follow-up PCP  Admission was considered.    The patient improved significantly and was discharged in stable condition. Detailed discussions were had with the patient regarding current findings, and need for close f/u with PCP or on call doctor. The patient has been instructed to return immediately if the symptoms worsen in any way for re-evaluation. Patient verbalized understanding and is in agreement with current care plan. All questions answered prior to discharge.              Social determinants of health include -  Counseled patient for approximately 3 minutes regarding smoking cessation. Discussed risks of smoking and how they applied and affected their visit here today. Patient not ready to quit at this time, however will follow up with their primary doctor when they are.   CPT code: 81275: intermediate counseling for smoking cessation   Social History   Socioeconomic History   Marital status: Single    Spouse name: Not on file   Number of children: Not on file   Years of education: Not on file   Highest education level: Not on file  Occupational History   Not on file  Tobacco Use   Smoking status: Every Day    Types: Cigars   Smokeless tobacco: Never  Vaping Use   Vaping Use: Never used  Substance and Sexual Activity    Alcohol use: No    Alcohol/week: 0.0 standard drinks   Drug use: No   Sexual activity: Not on file  Other Topics Concern   Not on file  Social History Narrative   ** Merged History Encounter **       11th grade, lives with mom, grandma and sister   Social Determinants of Health   Financial Resource Strain: Not on file  Food Insecurity: Not on file  Transportation Needs: Not on file  Physical Activity: Not on file  Stress: Not on file  Social Connections: Not on file  Intimate Partner Violence: Not on file      This chart was dictated using voice recognition software.  Despite best efforts to proofread,  errors can occur which can change the documentation meaning.         Final Clinical Impression(s) / ED Diagnoses Final diagnoses:  None    Rx / DC Orders ED Discharge Orders     None         Sloan Leiter, DO 08/17/21 [phone]

## 2021-08-17 NOTE — Discharge Instructions (Signed)
It was a pleasure caring for you today in the emergency department. ° °Please return to the emergency department for any worsening or worrisome symptoms. ° ° °

## 2021-08-17 NOTE — ED Notes (Signed)
ED Provider at bedside. 

## 2021-08-17 NOTE — ED Notes (Addendum)
Left radial pulse strong, color WNL, temp WNL, no numbness or tingling, has throbbing type pain. Ice pack provided
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
## 2021-08-19 ENCOUNTER — Emergency Department (HOSPITAL_BASED_OUTPATIENT_CLINIC_OR_DEPARTMENT_OTHER)
Admission: EM | Admit: 2021-08-19 | Discharge: 2021-08-19 | Disposition: A | Discharge status: Home / Self Care | Payer: Self-pay | Attending: Emergency Medicine | Admitting: Emergency Medicine

## 2021-08-19 ENCOUNTER — Encounter (HOSPITAL_BASED_OUTPATIENT_CLINIC_OR_DEPARTMENT_OTHER): Payer: Self-pay | Admitting: Urology

## 2021-08-19 ENCOUNTER — Emergency Department (HOSPITAL_BASED_OUTPATIENT_CLINIC_OR_DEPARTMENT_OTHER): Payer: Self-pay

## 2021-08-19 ENCOUNTER — Other Ambulatory Visit: Payer: Self-pay

## 2021-08-19 DIAGNOSIS — W500XXD Accidental hit or strike by another person, subsequent encounter: Secondary | ICD-10-CM | POA: Insufficient documentation

## 2021-08-19 DIAGNOSIS — S60222D Contusion of left hand, subsequent encounter: Secondary | ICD-10-CM | POA: Insufficient documentation

## 2021-08-19 MED ORDER — NAPROXEN 250 MG PO TABS
500.0000 mg | ORAL_TABLET | Freq: Once | ORAL | Status: AC
  Administered 2021-08-19: 500 mg via ORAL
  Filled 2021-08-19: qty 2

## 2021-08-19 MED ORDER — NAPROXEN 500 MG PO TABS: 500.0000 mg | ORAL_TABLET | Freq: Two times a day (BID) | ORAL | 0 refills | Status: AC

## 2021-08-19 NOTE — Discharge Instructions (Addendum)
You have been provided the contact information for Dr. Frazier Butt, a orthopedic hand specialist.  Please call and schedule appointment to follow-up with him within the next few days for reevaluation and continued medical management.  The ibuprofen prescription has been discontinued.  You have been provided a prescription for naproxen.  You may take 1 tablet every 12 hours as needed for pain relief.  Always take with water plenty of food.  Continue utilize rest, compression, ice therapy, and elevation for additional symptom relief.  Return to the ED for new or worsening symptoms as discussed.
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
## 2021-08-19 NOTE — ED Provider Notes (Signed)
[facility] EMERGENCY DEPARTMENT Provider Note   CSN: [id number] Arrival date & time: 08/19/21  2027     History {Add pertinent medical, surgical, social history, OB history to HPI:1} Chief Complaint  Patient presents with   Hand Pain    Clinton Callahan is a 26 y.o. male returns the ED today for continued left hand pain.  Recently injured his left hand with blunt trauma 2 days ago.  Seen in the ED same day, x-rays were done, showed no evidence of fracture or dislocation.  Patient has continued pain and reduced range of motion.  Denies numbness or tingling of the left hand or fingers.  Has been trying to ice and use ibuprofen with little relief.  The history is provided by the patient and medical records.  Hand Pain      Home Medications Prior to Admission medications   Medication Sig Start Date End Date Taking? Authorizing Provider  naproxen (NAPROSYN) 500 MG tablet Take 1 tablet (500 mg total) by mouth 2 (two) times daily. 08/19/21  Yes Cecil Cobbsockerham, Jarold Macomber M, PA-C  acetaminophen (TYLENOL) 325 MG tablet Take 2 tablets (650 mg total) by mouth every 6 (six) hours as needed. 08/17/21   Sloan LeiterGray, Samuel A, DO      Allergies    Patient has no known allergies.    Review of Systems   Review of Systems  Musculoskeletal:        Left hand pain   Physical Exam Updated Vital Signs BP (!) 145/93 (BP Location: Right Arm)   Pulse (!) 54   Temp 98.2 F (36.8 C) (Oral)   Resp 16   Ht 6' (1.829 m)   Wt 68 kg   SpO2 100%   BMI 20.34 kg/m  Physical Exam Vitals and nursing note reviewed.  Constitutional:      General: He is not in acute distress.    Appearance: He is well-developed.  HENT:     Head: Normocephalic and atraumatic.  Eyes:     Conjunctiva/sclera: Conjunctivae normal.  Cardiovascular:     Rate and Rhythm: Normal rate and regular rhythm.     Heart sounds: No murmur heard. Pulmonary:     Effort: Pulmonary effort is normal. No respiratory distress.     Breath  sounds: Normal breath sounds.  Abdominal:     Palpations: Abdomen is soft.     Tenderness: There is no abdominal tenderness.  Musculoskeletal:        General: No swelling.       Hands:     Cervical back: Neck supple.     Comments: Mild swelling and tenderness of the areas depicted above.  Mildly reduced range of motion due to elicited pain.  CRT less than 2 seconds.  No numbness or tingling of the left upper extremity.  Compartments soft.  No evidence of infection.  Skin:    General: Skin is warm and dry.     Capillary Refill: Capillary refill takes less than 2 seconds.  Neurological:     Mental Status: He is alert.  Psychiatric:        Mood and Affect: Mood normal.    ED Results / Procedures / Treatments   Labs (all labs ordered are listed, but only abnormal results are displayed) Labs Reviewed - No data to display  EKG None  Radiology DG Hand Complete Left  Result Date: 08/19/2021 CLINICAL DATA:  Injury. EXAM: LEFT HAND - COMPLETE 3+ VIEW COMPARISON:  Left hand x-ray 08/17/2021 FINDINGS:  There is no evidence of fracture or dislocation. There is no evidence of arthropathy or other focal bone abnormality. Soft tissues are unremarkable. IMPRESSION: Negative. Electronically Signed   By: Clinton Callahan M.D.   On: 08/19/2021 20:59    Procedures Procedures  {Document cardiac monitor, telemetry assessment procedure when appropriate:1}  Medications Ordered in ED Medications  naproxen (NAPROSYN) tablet 500 mg (500 mg Oral Given 08/19/21 2221)    ED Course/ Medical Decision Making/ A&P                           Medical Decision Making Amount and/or Complexity of Data Reviewed External Data Reviewed: radiology and notes. Labs: ordered. Decision-making details documented in ED Course. Radiology: ordered and independent interpretation performed. Decision-making details documented in ED Course. ECG/medicine tests: ordered and independent interpretation performed. Decision-making  details documented in ED Course.  Risk OTC drugs. Prescription drug management.   26 y.o. male presents to the ED for concern of Hand Pain   This involves an extensive number of treatment options, and is a complaint that carries with it a high risk of complications and morbidity.   Past Medical History / Co-morbidities / Social History: Chronic tobacco use, for which cessation counseling was provided.  Additional History:  Internal and external records from outside source obtained and reviewed including ***  Physical Exam: Physical exam performed. The pertinent findings include: Mild tenderness and swelling of the left first carpal metacarpal joint on the anterior and posterior aspect of the hand.  Imaging Studies: I ordered imaging studies including x-ray left hand.  I independently visualized and interpreted said imaging.  Pertinent results include: Negative for acute bony pathology or dislocation I agree with the radiologist interpretation.  ED Course/Disposition: Pt well-appearing on exam.  *** Patient [name] negative for obvious fracture or dislocation.  Pain managed in ED.  Pt advised to follow up with orthopedics if symptoms persist for possibility of missed fracture diagnosis.  Pt given brace while in ED.  Conservative therapy recommended and discussed.  Patient in NAD and good condition at time of discharge.   After consideration of the diagnostic results and the patient's encounter today, I feel that the emergency department workup does not suggest an emergent condition requiring admission or immediate intervention beyond what has been performed at this time.  The patient is safe for discharge and has been instructed to return immediately for worsening symptoms, change in symptoms or any other concerns.  Discussed course of treatment thoroughly with the patient, whom demonstrated understanding.  Patient in agreement and has no further questions.  I discussed this case with my  attending physician Dr. Rubin Payor, who agreed with the proposed treatment course and cosigned this note including patient's presenting symptoms, physical exam, and planned diagnostics and interventions.  Attending physician stated agreement with plan or made changes to plan which were implemented.     This chart was dictated using voice recognition software.  Despite best efforts to proofread, errors can occur which can change the documentation meaning.   {Document critical care time when appropriate:1} {Document review of labs and clinical decision tools ie heart score, Chads2Vasc2 etc:1}  {Document your independent review of radiology images, and any outside records:1} {Document your discussion with family members, caretakers, and with consultants:1} {Document social determinants of health affecting pt's care:1} {Document your decision making why or why not admission, treatments were needed:1} Final Clinical Impression(s) / ED Diagnoses Final diagnoses:  Contusion of left  hand, subsequent encounter    Rx / DC Orders ED Discharge Orders          Ordered    naproxen (NAPROSYN) 500 MG tablet  2 times daily        08/19/21 2216

## 2021-08-26 ENCOUNTER — Ambulatory Visit: Payer: Self-pay | Admitting: Orthopedic Surgery

## 2022-01-28 ENCOUNTER — Emergency Department (HOSPITAL_BASED_OUTPATIENT_CLINIC_OR_DEPARTMENT_OTHER)
Admission: EM | Admit: 2022-01-28 | Discharge: 2022-01-28 | Disposition: A | Discharge status: Home / Self Care | Payer: Self-pay | Attending: Emergency Medicine | Admitting: Emergency Medicine

## 2022-01-28 ENCOUNTER — Encounter (HOSPITAL_BASED_OUTPATIENT_CLINIC_OR_DEPARTMENT_OTHER): Payer: Self-pay | Admitting: Emergency Medicine

## 2022-01-28 ENCOUNTER — Other Ambulatory Visit: Payer: Self-pay

## 2022-01-28 DIAGNOSIS — Z20822 Contact with and (suspected) exposure to covid-19: Secondary | ICD-10-CM | POA: Insufficient documentation

## 2022-01-28 DIAGNOSIS — J029 Acute pharyngitis, unspecified: Secondary | ICD-10-CM | POA: Insufficient documentation

## 2022-01-28 LAB — RESP PANEL BY RT-PCR (FLU A&B, COVID) ARPGX2
Influenza A by PCR: NEGATIVE
Influenza B by PCR: NEGATIVE
SARS Coronavirus 2 by RT PCR: NEGATIVE

## 2022-01-28 LAB — GROUP A STREP BY PCR: Group A Strep by PCR: NOT DETECTED

## 2022-01-28 NOTE — ED Provider Notes (Signed)
   [facility] EMERGENCY DEPARTMENT  Provider Note  CSN: [id number] Arrival date & time: 01/28/22 2031  History Chief Complaint  Patient presents with   Sore Throat    Clinton Callahan is a 26 y.o. male with no PMH reports onset of sore throat and swollen lymph nodes earlier today. Sore throat improved with APAP taken at home. Has not had a fever. No known sick contacts. No cough or fever.    Home Medications Prior to Admission medications   Medication Sig Start Date End Date Taking? Authorizing Provider  acetaminophen (TYLENOL) 325 MG tablet Take 2 tablets (650 mg total) by mouth every 6 (six) hours as needed. 08/17/21   Sloan Leiter, DO  naproxen (NAPROSYN) 500 MG tablet Take 1 tablet (500 mg total) by mouth 2 (two) times daily. 08/19/21   Cecil Cobbs, PA-C     Allergies    Patient has no known allergies.   Review of Systems   Review of Systems Please see HPI for pertinent positives and negatives  Physical Exam BP 126/76 (BP Location: Right Arm)   Pulse (!) 59   Temp 97.8 F (36.6 C) (Oral)   Resp 18   Ht 6' (1.829 m)   Wt 68 kg   SpO2 100%   BMI 20.34 kg/m   Physical Exam Vitals and nursing note reviewed.  HENT:     Head: Normocephalic.     Nose: Nose normal.     Mouth/Throat:     Mouth: Mucous membranes are moist.     Pharynx: No pharyngeal swelling or oropharyngeal exudate.     Tonsils: No tonsillar exudate or tonsillar abscesses.  Eyes:     Extraocular Movements: Extraocular movements intact.  Pulmonary:     Effort: Pulmonary effort is normal.  Musculoskeletal:        General: Normal range of motion.     Cervical back: Neck supple.  Lymphadenopathy:     Cervical: Cervical adenopathy present.  Skin:    Findings: No rash (on exposed skin).  Neurological:     Mental Status: He is alert and oriented to person, place, and time.  Psychiatric:        Mood and Affect: Mood normal.     ED Results / Procedures / Treatments    EKG None  Procedures Procedures  Medications Ordered in the ED Medications - No data to display  Initial Impression and Plan  Patient here with nonspecific sore throat, some swollen lymph nodes, strep, Covid and Flu tests are negative. Suspect a viral infection. Symptoms currently well controlled on OTC meds. Recommend he continue with supportive care at home.   ED Course       MDM Rules/Calculators/A&P Medical Decision Making Problems Addressed: Sore throat: acute illness or injury  Amount and/or Complexity of Data Reviewed Labs: ordered. Decision-making details documented in ED Course.  Risk OTC drugs.    Final Clinical Impression(s) / ED Diagnoses Final diagnoses:  Sore throat    Rx / DC Orders ED Discharge Orders     None        Pollyann Savoy, MD 01/28/22 2324

## 2022-01-28 NOTE — ED Triage Notes (Addendum)
Sore throat started today. Swollen lymph node on right side. Denies other sx. Throat red with white spots upon assessment. Pt maintaining saliva and speaking in clear, full sentences. NAD.

## 2022-07-04 ENCOUNTER — Other Ambulatory Visit: Payer: Self-pay

## 2022-07-04 ENCOUNTER — Emergency Department (HOSPITAL_BASED_OUTPATIENT_CLINIC_OR_DEPARTMENT_OTHER): Payer: Self-pay

## 2022-07-04 ENCOUNTER — Encounter (HOSPITAL_BASED_OUTPATIENT_CLINIC_OR_DEPARTMENT_OTHER): Payer: Self-pay | Admitting: Emergency Medicine

## 2022-07-04 ENCOUNTER — Emergency Department (HOSPITAL_BASED_OUTPATIENT_CLINIC_OR_DEPARTMENT_OTHER)
Admission: EM | Admit: 2022-07-04 | Discharge: 2022-07-04 | Discharge status: Against Medical Advice | Payer: Self-pay | Attending: Emergency Medicine | Admitting: Emergency Medicine

## 2022-07-04 DIAGNOSIS — M549 Dorsalgia, unspecified: Secondary | ICD-10-CM | POA: Insufficient documentation

## 2022-07-04 DIAGNOSIS — Z5321 Procedure and treatment not carried out due to patient leaving prior to being seen by health care provider: Secondary | ICD-10-CM | POA: Insufficient documentation

## 2022-07-04 NOTE — ED Notes (Signed)
Patient transported to [name] 

## 2022-07-04 NOTE — ED Triage Notes (Signed)
Pt arrives pov, steady gait, with c/o acute RT side back pain starting today, that is sharp, stabbing, worsens when attempting deep inspiration. Pt tearful in triage. Pt denies injury

## 2023-05-26 ENCOUNTER — Other Ambulatory Visit: Payer: Self-pay

## 2023-05-26 ENCOUNTER — Emergency Department (HOSPITAL_BASED_OUTPATIENT_CLINIC_OR_DEPARTMENT_OTHER)
Admission: EM | Admit: 2023-05-26 | Discharge: 2023-05-26 | Disposition: A | Discharge status: Home / Self Care | Payer: Self-pay | Attending: Emergency Medicine | Admitting: Emergency Medicine

## 2023-05-26 DIAGNOSIS — Z87891 Personal history of nicotine dependence: Secondary | ICD-10-CM | POA: Insufficient documentation

## 2023-05-26 DIAGNOSIS — J029 Acute pharyngitis, unspecified: Secondary | ICD-10-CM | POA: Diagnosis present

## 2023-05-26 DIAGNOSIS — K115 Sialolithiasis: Secondary | ICD-10-CM | POA: Insufficient documentation

## 2023-05-26 LAB — RESP PANEL BY RT-PCR (RSV, FLU A&B, COVID)  RVPGX2
Influenza A by PCR: NEGATIVE
Influenza B by PCR: NEGATIVE
Resp Syncytial Virus by PCR: NEGATIVE
SARS Coronavirus 2 by RT PCR: NEGATIVE

## 2023-05-26 LAB — GROUP A STREP BY PCR: Group A Strep by PCR: NOT DETECTED

## 2023-05-26 NOTE — Discharge Instructions (Signed)
 You were seen in the emergency department for sore throat.  You have a salivary gland stone underneath your tongue. This could be contributing to your sore throat, especially if you have other stones in your other salivary glands.   I recommend using lemon drops or tart candies to suck on and help pull the stones out.  You can take ibuprofen, tylenol, or aleve as needed for pain.   Continue to monitor how you're doing and return to the ER for new or worsening symptoms such as inability to swallow your own spit, difficulty breathing, or if you develop a fever.

## 2023-05-26 NOTE — ED Provider Notes (Signed)
 [place] [facility] Provider Note   CSN: [id number] Arrival date & time: 05/26/23  0840     History  Chief Complaint  Patient presents with   Sore Throat    Neck swelling    Clinton Callahan is a 28 y.o. male with hx of tobacco use, who presents to the ED complaining of sore throat since yesterday. Feels a swollen lymph node on the right. Took some aleve and tylenol yesterday with relief. Has pain with swallowing, tolerating own secretions. Also mentions a "white dot" under his tongue he noticed the other day.    Sore Throat       Home Medications Prior to Admission medications   Medication Sig Start Date End Date Taking? Authorizing Provider  acetaminophen (TYLENOL) 325 MG tablet Take 2 tablets (650 mg total) by mouth every 6 (six) hours as needed. 08/17/21   Sloan Leiter, DO  naproxen (NAPROSYN) 500 MG tablet Take 1 tablet (500 mg total) by mouth 2 (two) times daily. 08/19/21   Cecil Cobbs, PA-C      Allergies    Patient has no known allergies.    Review of Systems   Review of Systems  HENT:  Positive for sore throat and trouble swallowing.   All other systems reviewed and are negative.   Physical Exam Updated Vital Signs BP 114/67   Pulse 63   Temp 98.2 F (36.8 C) (Oral)   Resp 18   SpO2 97%  Physical Exam Vitals and nursing note reviewed.  Constitutional:      Appearance: Normal appearance.  HENT:     Head: Normocephalic and atraumatic.     Mouth/Throat:     Lips: Pink.     Mouth: Mucous membranes are moist.     Dentition: Normal dentition.     Pharynx: Posterior oropharyngeal erythema present.     Tonsils: No tonsillar exudate or tonsillar abscesses. 0 on the right. 0 on the left.     Comments: Sialolithiasis sublingual Eyes:     Conjunctiva/sclera: Conjunctivae normal.  Pulmonary:     Effort: Pulmonary effort is normal. No respiratory distress.  Musculoskeletal:     Cervical back: Full passive range  of motion without pain.  Lymphadenopathy:     Cervical: Cervical adenopathy present.     Right cervical: Superficial cervical adenopathy present.  Skin:    General: Skin is warm and dry.  Neurological:     Mental Status: He is alert.  Psychiatric:        Mood and Affect: Mood normal.        Behavior: Behavior normal.     ED Results / Procedures / Treatments   Labs (all labs ordered are listed, but only abnormal results are displayed) Labs Reviewed  GROUP A STREP BY PCR  RESP PANEL BY RT-PCR (RSV, FLU A&B, COVID)  [id number]    EKG None  Radiology No results found.  Procedures Procedures   Discussed smoking cessation with patient and was they were offerred resources to help stop.  Total time was 5 min CPT code 40981.   Medications Ordered in ED Medications - No data to display  ED Course/ Medical Decision Making/ A&P                                 Medical Decision Making  This patient is a 28 y.o. male who presents to the ED  for concern of sore throat since yesterday.   Differential diagnoses prior to evaluation: The emergent differential diagnosis includes, but is not limited to,  Viral pharyngitis, strep pharyngitis, dental caries/abscess, esophagitis, sinusitis, post nasal drip, reflux, angioedema, RTA/PTA, Ludwig's angina. This is not an exhaustive differential.   Past Medical History / Co-morbidities / Additional history: Chart reviewed. Pertinent results include: tobacco use  Physical Exam: Physical exam performed. The pertinent findings include: Right sided cervical adenopathy. Posterior pharyngeal erythema without tonsillar swelling, exudate or abscess. Sublingual sialolithiasis.  Lab Tests/Imaging studies: I personally interpreted labs/imaging and the pertinent results include:  respiratory panel negative. Strep negative.    Disposition: After consideration of the diagnostic results and the patients response to treatment, I feel that emergency department  workup does not suggest an emergent condition requiring admission or immediate intervention beyond what has been performed at this time.  The plan is: Patient will be discharged with instructions to orally hydrate, rest, and use over-the-counter medications such as anti-inflammatories such as ibuprofen and Tylenol for pain. Will recommend tart candies to help promote stone passage. Do not feel he is needing antibiotics and there does not appear to be concurrent infection. He is tolerating his own secretions. The patient is safe for discharge and has been instructed to return immediately for worsening symptoms, change in symptoms or any other concerns.  Final Clinical Impression(s) / ED Diagnoses Final diagnoses:  Sublingual sialolithiasis  Sore throat    Rx / DC Orders ED Discharge Orders     None      Portions of this report may have been transcribed using voice recognition software. Every effort was made to ensure accuracy; however, inadvertent computerized transcription errors may be present.    Jeanella Flattery 05/26/23 1048    Rolan Bucco, MD 05/26/23 [phone]

## 2023-05-26 NOTE — ED Triage Notes (Signed)
 Pt reports sore throat and neck swelling that began yesterday.   Denies fever chills, NVD

## 2023-09-03 IMAGING — DX DG HAND COMPLETE 3+V*L*
3 series · 3 of 3 positions shown · non-contrast
Comparison: Left hand x-ray 08/17/2021

CLINICAL DATA: Injury.

EXAM:
LEFT HAND - COMPLETE 3+ VIEW

[hand pa]
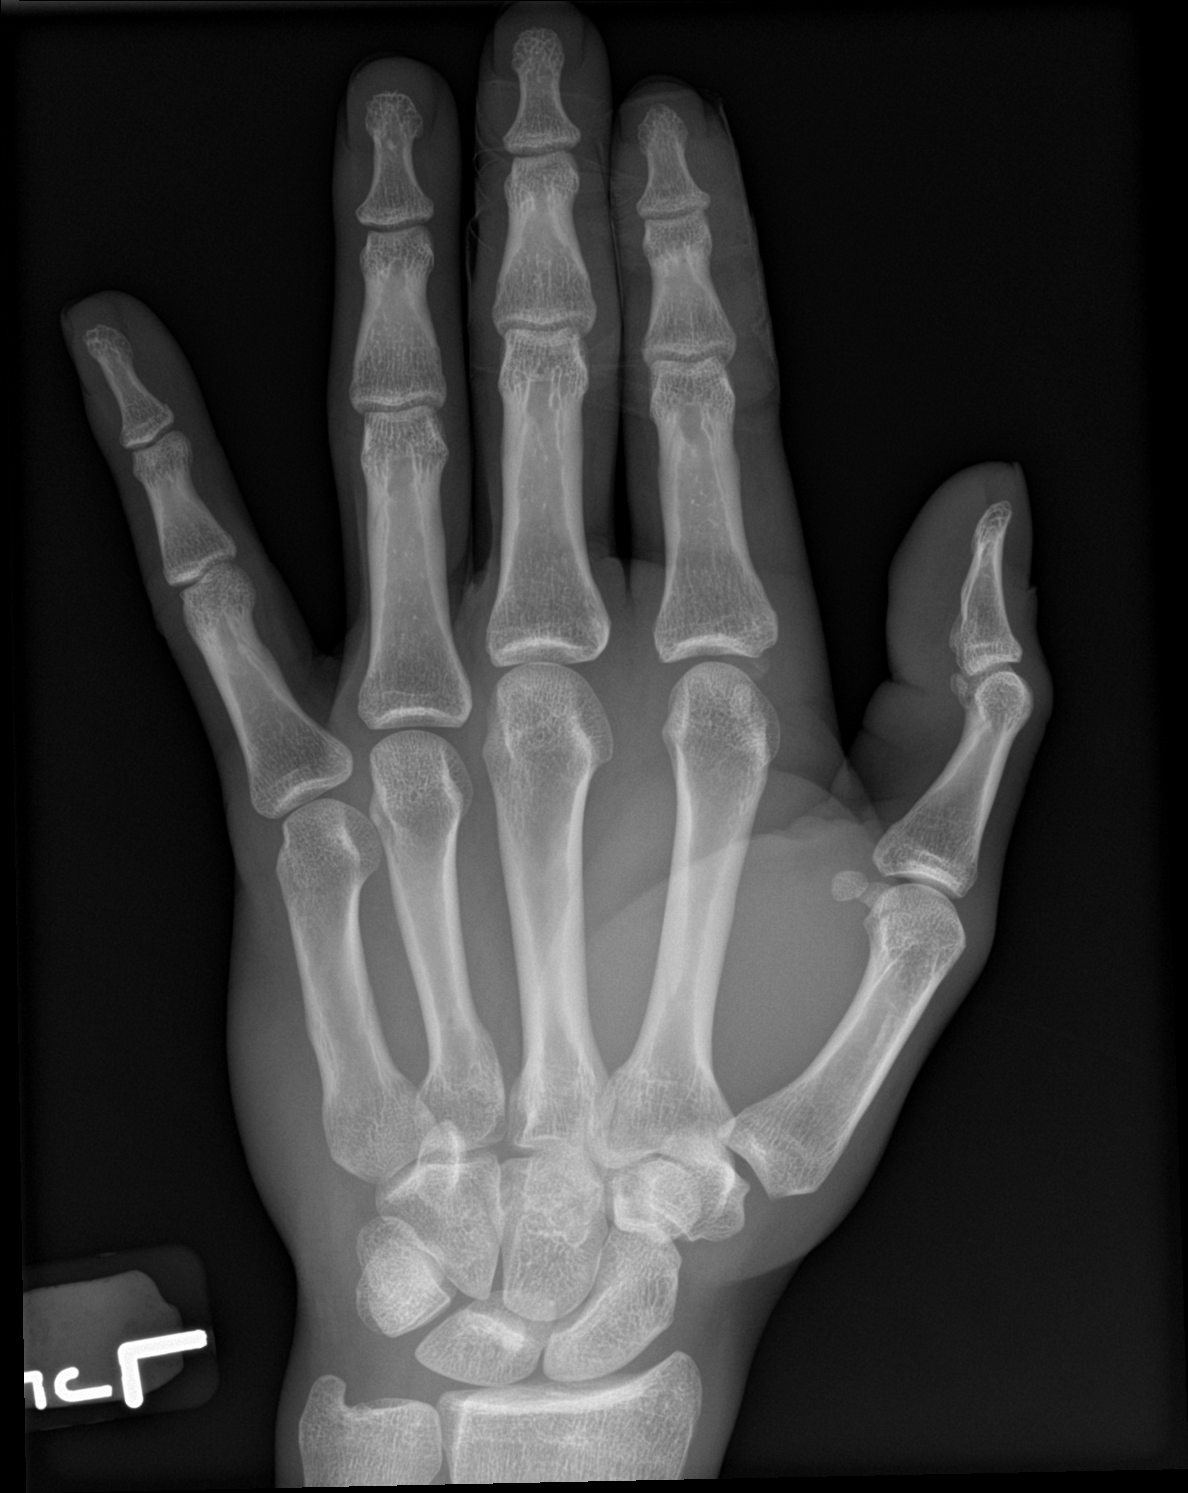

[hand obl]
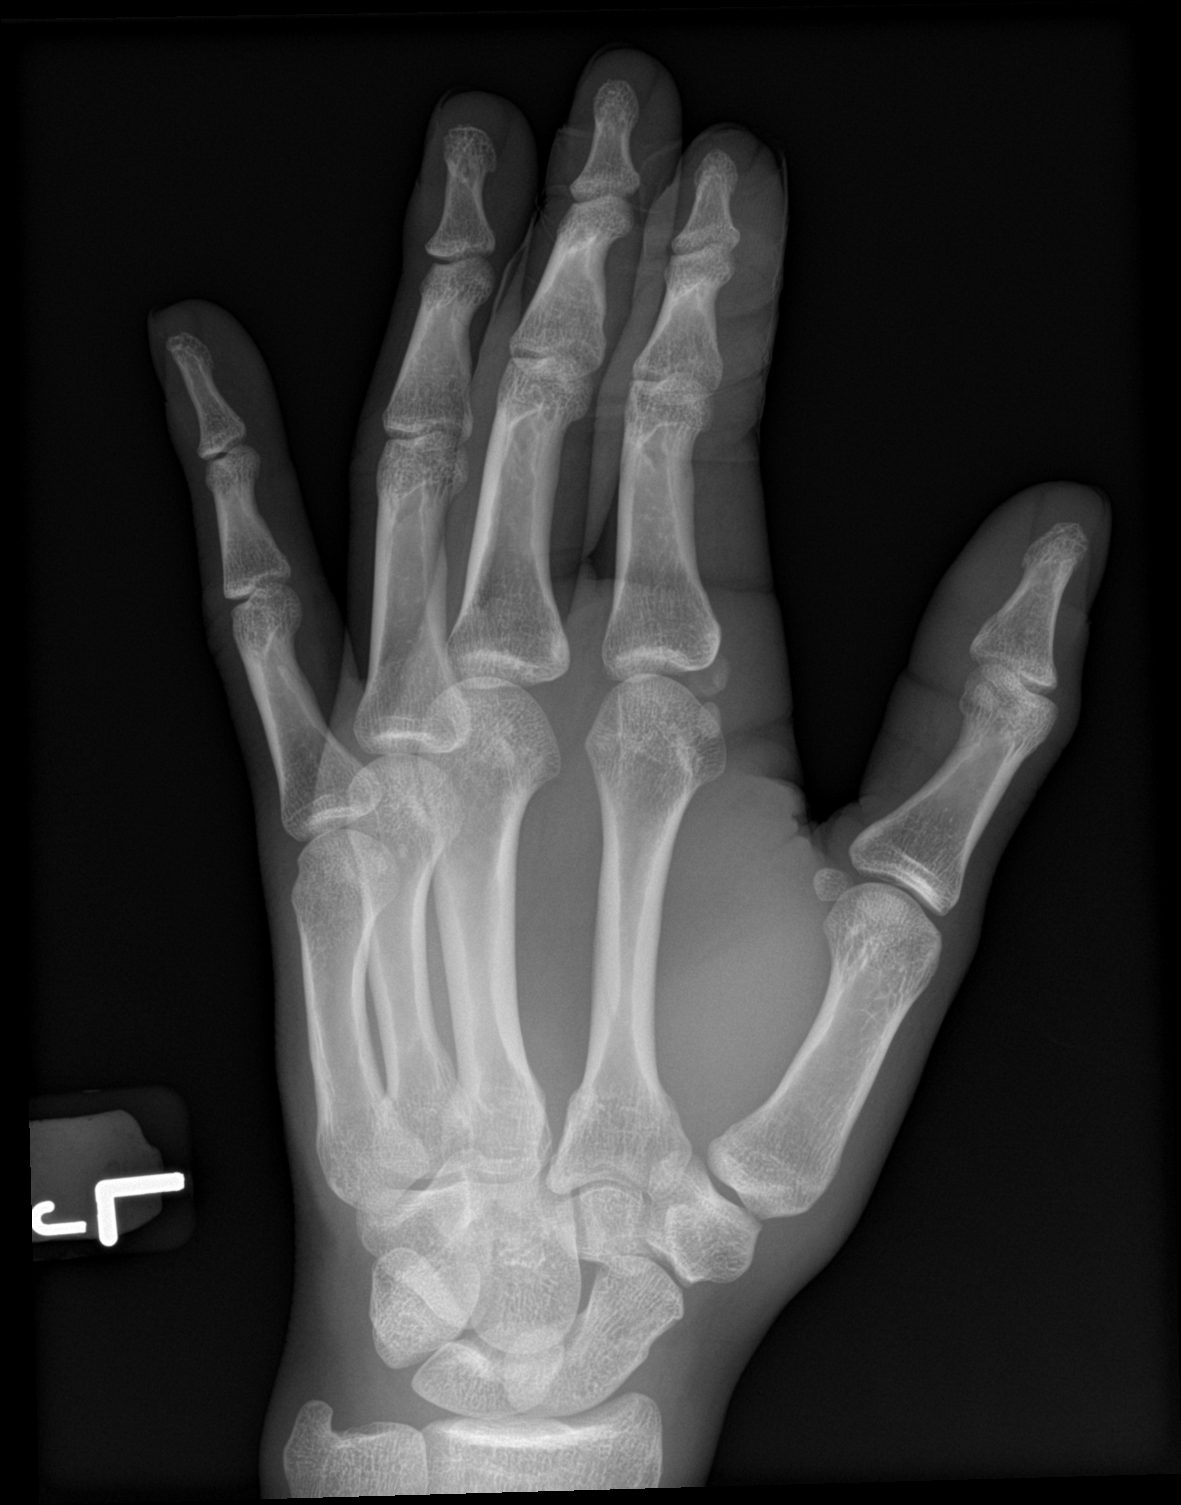

[hand lat]
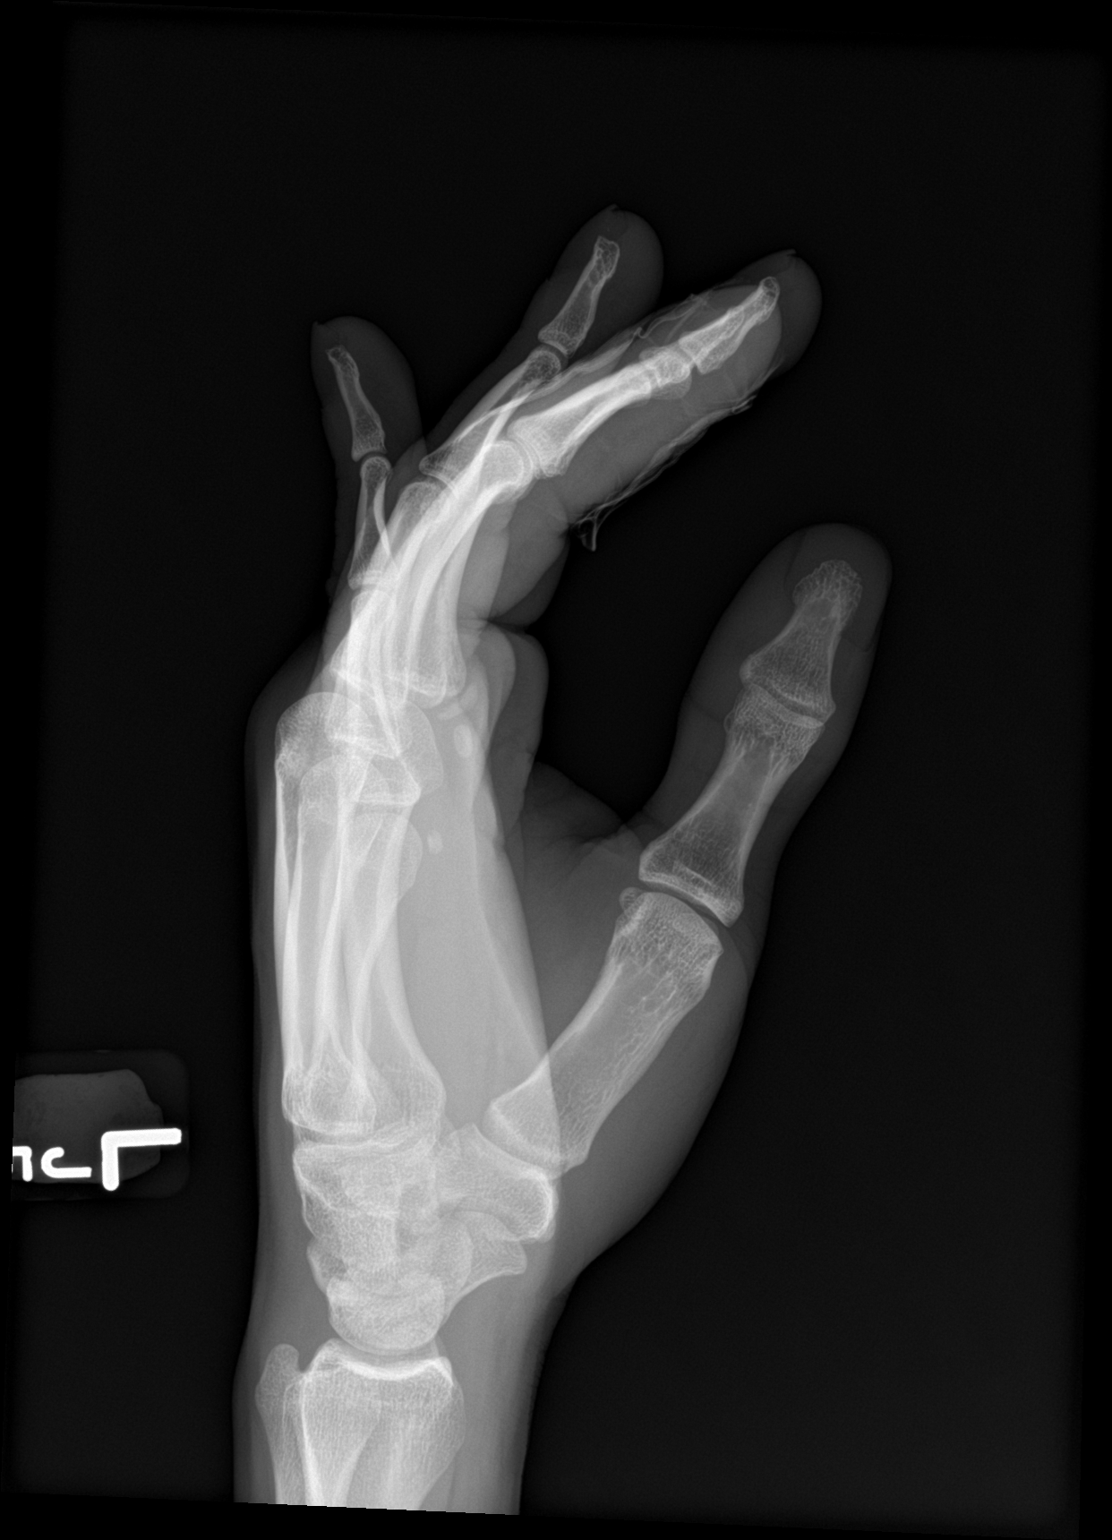

[3 of 3 positions shown; findings below may reference images not displayed]

FINDINGS: There is no evidence of fracture or dislocation. There is no
evidence of arthropathy or other focal bone abnormality. Soft
tissues are unremarkable.
IMPRESSION: Negative.
